# Patient Record
Sex: Male | Born: 1965 | Race: White | Hispanic: No | Marital: Single | Smoking: Former smoker
Health system: Southern US, Community
[De-identification: ages and names within clinical notes are randomized; demographics above are authoritative.]

## PROBLEM LIST (undated history)

## (undated) DIAGNOSIS — F909 Attention-deficit hyperactivity disorder, unspecified type: Secondary | ICD-10-CM

## (undated) DIAGNOSIS — F329 Major depressive disorder, single episode, unspecified: Secondary | ICD-10-CM

## (undated) DIAGNOSIS — G5603 Carpal tunnel syndrome, bilateral upper limbs: Secondary | ICD-10-CM

## (undated) DIAGNOSIS — I872 Venous insufficiency (chronic) (peripheral): Secondary | ICD-10-CM

## (undated) DIAGNOSIS — E785 Hyperlipidemia, unspecified: Secondary | ICD-10-CM

## (undated) DIAGNOSIS — I10 Essential (primary) hypertension: Secondary | ICD-10-CM

## (undated) DIAGNOSIS — E291 Testicular hypofunction: Secondary | ICD-10-CM

## (undated) DIAGNOSIS — F32A Depression, unspecified: Secondary | ICD-10-CM

## (undated) DIAGNOSIS — G4730 Sleep apnea, unspecified: Secondary | ICD-10-CM

## (undated) DIAGNOSIS — D582 Other hemoglobinopathies: Secondary | ICD-10-CM

## (undated) DIAGNOSIS — M67471 Ganglion, right ankle and foot: Secondary | ICD-10-CM

## (undated) HISTORY — DX: Venous insufficiency (chronic) (peripheral): I87.2

## (undated) HISTORY — DX: Ganglion, right ankle and foot: M67.471

## (undated) HISTORY — PX: CARPAL TUNNEL RELEASE: SHX101

## (undated) HISTORY — DX: Attention-deficit hyperactivity disorder, unspecified type: F90.9

## (undated) HISTORY — DX: Hyperlipidemia, unspecified: E78.5

## (undated) HISTORY — DX: Carpal tunnel syndrome, bilateral upper limbs: G56.03

## (undated) HISTORY — PX: VASECTOMY: SHX75

## (undated) HISTORY — DX: Sleep apnea, unspecified: G47.30

## (undated) HISTORY — PX: SHOULDER SURGERY: SHX246

## (undated) HISTORY — DX: Depression, unspecified: F32.A

## (undated) HISTORY — DX: Other hemoglobinopathies: D58.2

## (undated) HISTORY — DX: Testicular hypofunction: E29.1

---
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
## 1999-06-27 ENCOUNTER — Ambulatory Visit (HOSPITAL_COMMUNITY): Admission: RE | Admit: 1999-06-27 | Discharge: 1999-06-27 | Payer: Self-pay | Admitting: Specialist

## 1999-06-27 ENCOUNTER — Encounter: Payer: Self-pay | Admitting: Specialist

## 2003-01-11 ENCOUNTER — Encounter: Payer: Self-pay | Admitting: Emergency Medicine

## 2003-01-11 ENCOUNTER — Emergency Department (HOSPITAL_COMMUNITY): Admission: EM | Admit: 2003-01-11 | Discharge: 2003-01-11 | Payer: Self-pay | Admitting: Emergency Medicine

## 2003-06-27 ENCOUNTER — Emergency Department (HOSPITAL_COMMUNITY): Admission: EM | Admit: 2003-06-27 | Discharge: 2003-06-27 | Payer: Self-pay | Admitting: Emergency Medicine

## 2016-02-17 DIAGNOSIS — F329 Major depressive disorder, single episode, unspecified: Secondary | ICD-10-CM | POA: Diagnosis not present

## 2016-02-17 DIAGNOSIS — Z0000 Encounter for general adult medical examination without abnormal findings: Secondary | ICD-10-CM | POA: Diagnosis not present

## 2016-02-17 DIAGNOSIS — I10 Essential (primary) hypertension: Secondary | ICD-10-CM | POA: Diagnosis not present

## 2016-02-17 DIAGNOSIS — F909 Attention-deficit hyperactivity disorder, unspecified type: Secondary | ICD-10-CM | POA: Diagnosis not present

## 2016-02-17 DIAGNOSIS — E785 Hyperlipidemia, unspecified: Secondary | ICD-10-CM | POA: Diagnosis not present

## 2016-04-16 DIAGNOSIS — M25561 Pain in right knee: Secondary | ICD-10-CM | POA: Diagnosis not present

## 2016-05-07 DIAGNOSIS — Z125 Encounter for screening for malignant neoplasm of prostate: Secondary | ICD-10-CM | POA: Diagnosis not present

## 2016-05-07 DIAGNOSIS — E291 Testicular hypofunction: Secondary | ICD-10-CM | POA: Diagnosis not present

## 2016-05-14 DIAGNOSIS — E291 Testicular hypofunction: Secondary | ICD-10-CM | POA: Diagnosis not present

## 2016-06-17 DIAGNOSIS — S83231A Complex tear of medial meniscus, current injury, right knee, initial encounter: Secondary | ICD-10-CM | POA: Diagnosis not present

## 2016-06-17 DIAGNOSIS — M2341 Loose body in knee, right knee: Secondary | ICD-10-CM | POA: Diagnosis not present

## 2016-06-17 DIAGNOSIS — M25361 Other instability, right knee: Secondary | ICD-10-CM | POA: Diagnosis not present

## 2016-06-17 DIAGNOSIS — S83501A Sprain of unspecified cruciate ligament of right knee, initial encounter: Secondary | ICD-10-CM | POA: Diagnosis not present

## 2016-06-17 DIAGNOSIS — S83241A Other tear of medial meniscus, current injury, right knee, initial encounter: Secondary | ICD-10-CM | POA: Diagnosis not present

## 2016-06-17 DIAGNOSIS — S83281A Other tear of lateral meniscus, current injury, right knee, initial encounter: Secondary | ICD-10-CM | POA: Diagnosis not present

## 2016-06-17 DIAGNOSIS — M94261 Chondromalacia, right knee: Secondary | ICD-10-CM | POA: Diagnosis not present

## 2016-06-17 HISTORY — PX: OTHER SURGICAL HISTORY: SHX169

## 2016-06-20 ENCOUNTER — Encounter (HOSPITAL_BASED_OUTPATIENT_CLINIC_OR_DEPARTMENT_OTHER): Admission: RE | Payer: Self-pay | Source: Ambulatory Visit

## 2016-06-20 ENCOUNTER — Ambulatory Visit (HOSPITAL_BASED_OUTPATIENT_CLINIC_OR_DEPARTMENT_OTHER)
Admission: RE | Admit: 2016-06-20 | Payer: BLUE CROSS/BLUE SHIELD | Source: Ambulatory Visit | Admitting: Orthopedic Surgery

## 2016-06-20 SURGERY — ARTHROSCOPY, KNEE, WITH MEDIAL MENISCECTOMY
Anesthesia: General | Site: Knee | Laterality: Right

## 2016-06-25 DIAGNOSIS — M2341 Loose body in knee, right knee: Secondary | ICD-10-CM | POA: Diagnosis not present

## 2016-07-17 DIAGNOSIS — M2341 Loose body in knee, right knee: Secondary | ICD-10-CM | POA: Diagnosis not present

## 2016-07-17 DIAGNOSIS — M25561 Pain in right knee: Secondary | ICD-10-CM | POA: Diagnosis not present

## 2016-07-19 ENCOUNTER — Encounter (HOSPITAL_BASED_OUTPATIENT_CLINIC_OR_DEPARTMENT_OTHER)
Admission: RE | Disposition: A | Discharge status: Home / Self Care | Payer: Self-pay | Source: Ambulatory Visit | Attending: Orthopedic Surgery

## 2016-07-19 ENCOUNTER — Ambulatory Visit (HOSPITAL_BASED_OUTPATIENT_CLINIC_OR_DEPARTMENT_OTHER)
Admission: RE | Admit: 2016-07-19 | Discharge: 2016-07-19 | Disposition: A | Discharge status: Home / Self Care | Payer: BLUE CROSS/BLUE SHIELD | Source: Ambulatory Visit | Attending: Orthopedic Surgery | Admitting: Orthopedic Surgery

## 2016-07-19 ENCOUNTER — Encounter (HOSPITAL_BASED_OUTPATIENT_CLINIC_OR_DEPARTMENT_OTHER): Payer: Self-pay

## 2016-07-19 ENCOUNTER — Ambulatory Visit (HOSPITAL_BASED_OUTPATIENT_CLINIC_OR_DEPARTMENT_OTHER): Payer: BLUE CROSS/BLUE SHIELD | Admitting: Anesthesiology

## 2016-07-19 ENCOUNTER — Other Ambulatory Visit: Payer: Self-pay | Admitting: Physician Assistant

## 2016-07-19 DIAGNOSIS — F329 Major depressive disorder, single episode, unspecified: Secondary | ICD-10-CM | POA: Insufficient documentation

## 2016-07-19 DIAGNOSIS — I10 Essential (primary) hypertension: Secondary | ICD-10-CM | POA: Diagnosis not present

## 2016-07-19 DIAGNOSIS — M009 Pyogenic arthritis, unspecified: Secondary | ICD-10-CM | POA: Insufficient documentation

## 2016-07-19 DIAGNOSIS — Z888 Allergy status to other drugs, medicaments and biological substances status: Secondary | ICD-10-CM | POA: Diagnosis not present

## 2016-07-19 DIAGNOSIS — F17210 Nicotine dependence, cigarettes, uncomplicated: Secondary | ICD-10-CM | POA: Insufficient documentation

## 2016-07-19 DIAGNOSIS — T814XXA Infection following a procedure, initial encounter: Secondary | ICD-10-CM | POA: Diagnosis not present

## 2016-07-19 DIAGNOSIS — L02415 Cutaneous abscess of right lower limb: Secondary | ICD-10-CM | POA: Diagnosis not present

## 2016-07-19 DIAGNOSIS — G4730 Sleep apnea, unspecified: Secondary | ICD-10-CM | POA: Diagnosis not present

## 2016-07-19 DIAGNOSIS — M2341 Loose body in knee, right knee: Secondary | ICD-10-CM | POA: Diagnosis not present

## 2016-07-19 DIAGNOSIS — B999 Unspecified infectious disease: Secondary | ICD-10-CM

## 2016-07-19 DIAGNOSIS — M869 Osteomyelitis, unspecified: Secondary | ICD-10-CM | POA: Diagnosis not present

## 2016-07-19 HISTORY — DX: Essential (primary) hypertension: I10

## 2016-07-19 HISTORY — DX: Major depressive disorder, single episode, unspecified: F32.9

## 2016-07-19 HISTORY — DX: Depression, unspecified: F32.A

## 2016-07-19 HISTORY — PX: KNEE ARTHROSCOPY: SHX127

## 2016-07-19 HISTORY — PX: INCISION AND DRAINAGE: SHX5863

## 2016-07-19 SURGERY — ARTHROSCOPY, KNEE
Anesthesia: General | Site: Knee | Laterality: Right

## 2016-07-19 MED ORDER — LIDOCAINE 2% (20 MG/ML) 5 ML SYRINGE
INTRAMUSCULAR | Status: DC | PRN
  Administered 2016-07-19: 50 mg via INTRAVENOUS

## 2016-07-19 MED ORDER — LIDOCAINE 2% (20 MG/ML) 5 ML SYRINGE
INTRAMUSCULAR | Status: AC
  Filled 2016-07-19: qty 5

## 2016-07-19 MED ORDER — FENTANYL CITRATE (PF) 100 MCG/2ML IJ SOLN
INTRAMUSCULAR | Status: AC
  Filled 2016-07-19: qty 2

## 2016-07-19 MED ORDER — OXYCODONE-ACETAMINOPHEN 5-325 MG PO TABS: 1.0000 | ORAL_TABLET | ORAL | 0 refills | Status: DC | PRN

## 2016-07-19 MED ORDER — FENTANYL CITRATE (PF) 100 MCG/2ML IJ SOLN
INTRAMUSCULAR | Status: DC | PRN
  Administered 2016-07-19: 50 ug via INTRAVENOUS
  Administered 2016-07-19: 100 ug via INTRAVENOUS

## 2016-07-19 MED ORDER — FENTANYL CITRATE (PF) 100 MCG/2ML IJ SOLN
25.0000 ug | INTRAMUSCULAR | Status: DC | PRN
  Administered 2016-07-19 (×2): 25 ug via INTRAVENOUS
  Administered 2016-07-19: 50 ug via INTRAVENOUS

## 2016-07-19 MED ORDER — EPHEDRINE SULFATE 50 MG/ML IJ SOLN
INTRAMUSCULAR | Status: DC | PRN
  Administered 2016-07-19: 10 mg via INTRAVENOUS
  Administered 2016-07-19: 15 mg via INTRAVENOUS

## 2016-07-19 MED ORDER — MIDAZOLAM HCL 2 MG/2ML IJ SOLN
INTRAMUSCULAR | Status: AC
  Filled 2016-07-19: qty 2

## 2016-07-19 MED ORDER — DEXAMETHASONE SODIUM PHOSPHATE 10 MG/ML IJ SOLN
INTRAMUSCULAR | Status: AC
  Filled 2016-07-19: qty 1

## 2016-07-19 MED ORDER — PHENYLEPHRINE HCL 10 MG/ML IJ SOLN
INTRAVENOUS | Status: DC | PRN
  Administered 2016-07-19: 50 ug/min via INTRAVENOUS

## 2016-07-19 MED ORDER — MIDAZOLAM HCL 5 MG/5ML IJ SOLN
INTRAMUSCULAR | Status: DC | PRN
  Administered 2016-07-19: 2 mg via INTRAVENOUS

## 2016-07-19 MED ORDER — POVIDONE-IODINE 7.5 % EX SOLN: Freq: Once | CUTANEOUS | Status: DC

## 2016-07-19 MED ORDER — LACTATED RINGERS IV SOLN: INTRAVENOUS | Status: DC

## 2016-07-19 MED ORDER — OXYCODONE HCL 5 MG PO TABS
5.0000 mg | ORAL_TABLET | Freq: Once | ORAL | Status: AC | PRN
  Administered 2016-07-19: 5 mg via ORAL

## 2016-07-19 MED ORDER — ONDANSETRON HCL 4 MG PO TABS: 4.0000 mg | ORAL_TABLET | Freq: Three times a day (TID) | ORAL | 0 refills | Status: DC | PRN

## 2016-07-19 MED ORDER — POLYMYXIN B-TRIMETHOPRIM 10000-0.1 UNIT/ML-% OP SOLN
1.0000 [drp] | Freq: Three times a day (TID) | OPHTHALMIC | Status: DC
  Administered 2016-07-19: 1 [drp] via OPHTHALMIC

## 2016-07-19 MED ORDER — BSS IO SOLN
INTRAOCULAR | Status: AC
  Filled 2016-07-19: qty 15

## 2016-07-19 MED ORDER — KETOROLAC TROMETHAMINE 0.5 % OP SOLN
OPHTHALMIC | Status: AC
  Filled 2016-07-19: qty 5

## 2016-07-19 MED ORDER — EPHEDRINE 5 MG/ML INJ
INTRAVENOUS | Status: AC
  Filled 2016-07-19: qty 10

## 2016-07-19 MED ORDER — PROPOFOL 10 MG/ML IV BOLUS
INTRAVENOUS | Status: DC | PRN
  Administered 2016-07-19: 200 mg via INTRAVENOUS

## 2016-07-19 MED ORDER — DEXAMETHASONE SODIUM PHOSPHATE 4 MG/ML IJ SOLN
INTRAMUSCULAR | Status: DC | PRN
  Administered 2016-07-19: 10 mg via INTRAVENOUS

## 2016-07-19 MED ORDER — BSS IO SOLN
15.0000 mL | Freq: Once | INTRAOCULAR | Status: AC
  Administered 2016-07-19: 15 mL

## 2016-07-19 MED ORDER — ACETAMINOPHEN 500 MG PO TABS
ORAL_TABLET | ORAL | Status: AC
  Filled 2016-07-19: qty 2

## 2016-07-19 MED ORDER — ONDANSETRON HCL 4 MG/2ML IJ SOLN
INTRAMUSCULAR | Status: AC
  Filled 2016-07-19: qty 2

## 2016-07-19 MED ORDER — LACTATED RINGERS IV SOLN
INTRAVENOUS | Status: DC
  Administered 2016-07-19: 12:00:00 via INTRAVENOUS

## 2016-07-19 MED ORDER — PROMETHAZINE HCL 25 MG/ML IJ SOLN: 6.2500 mg | INTRAMUSCULAR | Status: DC | PRN

## 2016-07-19 MED ORDER — KETOROLAC TROMETHAMINE 30 MG/ML IJ SOLN
INTRAMUSCULAR | Status: AC
  Filled 2016-07-19: qty 1

## 2016-07-19 MED ORDER — CEFAZOLIN SODIUM-DEXTROSE 2-4 GM/100ML-% IV SOLN
2.0000 g | INTRAVENOUS | Status: AC
  Administered 2016-07-19: 2 g via INTRAVENOUS

## 2016-07-19 MED ORDER — BUPIVACAINE HCL (PF) 0.5 % IJ SOLN
INTRAMUSCULAR | Status: AC
  Filled 2016-07-19: qty 30

## 2016-07-19 MED ORDER — BUPIVACAINE HCL 0.25 % IJ SOLN
INTRAMUSCULAR | Status: DC | PRN
  Administered 2016-07-19: 5 mL

## 2016-07-19 MED ORDER — KETOROLAC TROMETHAMINE 0.5 % OP SOLN
1.0000 [drp] | Freq: Three times a day (TID) | OPHTHALMIC | Status: DC | PRN
  Administered 2016-07-19: 1 [drp] via OPHTHALMIC

## 2016-07-19 MED ORDER — OXYCODONE HCL 5 MG PO TABS
ORAL_TABLET | ORAL | Status: AC
  Filled 2016-07-19: qty 1

## 2016-07-19 MED ORDER — CEFAZOLIN SODIUM-DEXTROSE 2-4 GM/100ML-% IV SOLN
INTRAVENOUS | Status: AC
  Filled 2016-07-19: qty 100

## 2016-07-19 MED ORDER — ACETAMINOPHEN 500 MG PO TABS
1000.0000 mg | ORAL_TABLET | Freq: Once | ORAL | Status: AC
  Administered 2016-07-19: 1000 mg via ORAL

## 2016-07-19 MED ORDER — POLYMYXIN B-TRIMETHOPRIM 10000-0.1 UNIT/ML-% OP SOLN
OPHTHALMIC | Status: AC
  Filled 2016-07-19: qty 10

## 2016-07-19 MED ORDER — KETOROLAC TROMETHAMINE 30 MG/ML IJ SOLN
INTRAMUSCULAR | Status: DC | PRN
  Administered 2016-07-19: 30 mg via INTRAVENOUS

## 2016-07-19 MED ORDER — ONDANSETRON HCL 4 MG/2ML IJ SOLN
INTRAMUSCULAR | Status: DC | PRN
  Administered 2016-07-19: 4 mg via INTRAVENOUS

## 2016-07-19 SURGICAL SUPPLY — 42 items
BANDAGE ACE 6X5 VEL STRL LF (GAUZE/BANDAGES/DRESSINGS) ×3 IMPLANT
BANDAGE ESMARK 6X9 LF (GAUZE/BANDAGES/DRESSINGS) IMPLANT
BLADE CUDA 5.5 (BLADE) IMPLANT
BLADE CUDA GRT WHITE 3.5 (BLADE) IMPLANT
BLADE CUTTER GATOR 3.5 (BLADE) ×3 IMPLANT
BLADE CUTTER MENIS 5.5 (BLADE) IMPLANT
BNDG CMPR 9X6 STRL LF SNTH (GAUZE/BANDAGES/DRESSINGS) ×1
BNDG COHESIVE 4X5 TAN STRL (GAUZE/BANDAGES/DRESSINGS) ×2 IMPLANT
BNDG ESMARK 6X9 LF (GAUZE/BANDAGES/DRESSINGS) ×3
BNDG GAUZE ELAST 4 BULKY (GAUZE/BANDAGES/DRESSINGS) ×2 IMPLANT
CHLORAPREP W/TINT 26ML (MISCELLANEOUS) ×5 IMPLANT
CUTTER MENISCUS  4.2MM (BLADE)
CUTTER MENISCUS 4.2MM (BLADE) IMPLANT
DRAPE ARTHROSCOPY W/POUCH 90 (DRAPES) ×3 IMPLANT
DRAPE IMP U-DRAPE 54X76 (DRAPES) ×3 IMPLANT
DRAPE U-SHAPE 47X51 STRL (DRAPES) ×3 IMPLANT
DRSG EMULSION OIL 3X3 NADH (GAUZE/BANDAGES/DRESSINGS) ×3 IMPLANT
DRSG TEGADERM 2-3/8X2-3/4 SM (GAUZE/BANDAGES/DRESSINGS) ×2 IMPLANT
GAUZE SPONGE 2X2 12PLY NS (GAUZE/BANDAGES/DRESSINGS) ×2 IMPLANT
GAUZE SPONGE 4X4 12PLY STRL (GAUZE/BANDAGES/DRESSINGS) ×6 IMPLANT
GLOVE BIO SURGEON STRL SZ7.5 (GLOVE) ×6 IMPLANT
GLOVE BIOGEL PI IND STRL 8 (GLOVE) ×2 IMPLANT
GLOVE BIOGEL PI INDICATOR 8 (GLOVE) ×4
GOWN STRL REUS W/ TWL LRG LVL3 (GOWN DISPOSABLE) ×3 IMPLANT
GOWN STRL REUS W/ TWL XL LVL3 (GOWN DISPOSABLE) ×1 IMPLANT
GOWN STRL REUS W/TWL LRG LVL3 (GOWN DISPOSABLE) ×9
GOWN STRL REUS W/TWL XL LVL3 (GOWN DISPOSABLE) ×3
KNEE WRAP E Z 3 GEL PACK (MISCELLANEOUS) IMPLANT
MANIFOLD NEPTUNE II (INSTRUMENTS) ×3 IMPLANT
PACK ARTHROSCOPY DSU (CUSTOM PROCEDURE TRAY) ×3 IMPLANT
PACK BASIN DAY SURGERY FS (CUSTOM PROCEDURE TRAY) ×3 IMPLANT
PADDING CAST ABS 6INX4YD NS (CAST SUPPLIES) ×2
PADDING CAST ABS COTTON 6X4 NS (CAST SUPPLIES) IMPLANT
PROBE BIPOLAR ATHRO 135MM 90D (MISCELLANEOUS) IMPLANT
SET ARTHROSCOPY TUBING (MISCELLANEOUS) ×3
SET ARTHROSCOPY TUBING LN (MISCELLANEOUS) ×1 IMPLANT
SPONGE GAUZE 4X4 12PLY STER LF (GAUZE/BANDAGES/DRESSINGS) ×2 IMPLANT
SUT ETHILON 3 0 PS 1 (SUTURE) ×5 IMPLANT
TAPE CLOTH 3X10 TAN LF (GAUZE/BANDAGES/DRESSINGS) IMPLANT
TOWEL OR 17X24 6PK STRL BLUE (TOWEL DISPOSABLE) ×3 IMPLANT
TOWEL OR NON WOVEN STRL DISP B (DISPOSABLE) ×3 IMPLANT
WATER STERILE IRR 1000ML POUR (IV SOLUTION) ×3 IMPLANT

## 2016-07-19 NOTE — Anesthesia Preprocedure Evaluation (Addendum)
Anesthesia Evaluation  Patient identified by MRN, date of birth, ID band Patient awake    Reviewed: Allergy & Precautions, NPO status , Patient's Chart, lab work & pertinent test results  Airway Mallampati: II  TM Distance: >3 FB Neck ROM: Full    Dental  (+) Teeth Intact, Dental Advisory Given   Pulmonary sleep apnea and Continuous Positive Airway Pressure Ventilation , Current Smoker,    Pulmonary exam normal breath sounds clear to auscultation       Cardiovascular Exercise Tolerance: Good hypertension, Pt. on medications Normal cardiovascular exam Rhythm:Regular Rate:Normal     Neuro/Psych PSYCHIATRIC DISORDERS Depression negative neurological ROS     GI/Hepatic negative GI ROS, Neg liver ROS,   Endo/Other  negative endocrine ROS  Renal/GU negative Renal ROS     Musculoskeletal negative musculoskeletal ROS (+)   Abdominal   Peds  Hematology negative hematology ROS (+)   Anesthesia Other Findings Day of surgery medications reviewed with the patient.  Reproductive/Obstetrics                           Anesthesia Physical Anesthesia Plan  ASA: II  Anesthesia Plan: General   Post-op Pain Management:    Induction: Intravenous  Airway Management Planned: LMA  Additional Equipment:   Intra-op Plan:   Post-operative Plan: Extubation in OR  Informed Consent: I have reviewed the patients History and Physical, chart, labs and discussed the procedure including the risks, benefits and alternatives for the proposed anesthesia with the patient or authorized representative who has indicated his/her understanding and acceptance.   Dental advisory given  Plan Discussed with: CRNA  Anesthesia Plan Comments: (Risks/benefits of general anesthesia discussed with patient including risk of damage to teeth, lips, gum, and tongue, nausea/vomiting, allergic reactions to medications, and the  possibility of heart attack, stroke and death.  All patient questions answered.  Patient wishes to proceed.)        Anesthesia Quick Evaluation

## 2016-07-19 NOTE — Anesthesia Procedure Notes (Signed)
Procedure Name: LMA Insertion Performed by: Mahad Newstrom W Pre-anesthesia Checklist: Patient identified, Emergency Drugs available, Suction available and Patient being monitored Patient Re-evaluated:Patient Re-evaluated prior to inductionOxygen Delivery Method: Circle system utilized Preoxygenation: Pre-oxygenation with 100% oxygen Intubation Type: IV induction Ventilation: Mask ventilation without difficulty LMA: LMA inserted LMA Size: 5.0 Number of attempts: 1 Placement Confirmation: positive ETCO2 Tube secured with: Tape Dental Injury: Teeth and Oropharynx as per pre-operative assessment        

## 2016-07-19 NOTE — H&P (Signed)
     ORTHOPAEDIC CONSULTATION  REQUESTING PHYSICIAN: Renette Butters, MD  Chief Complaint: infection Right leg  HPI: Terry Harper is a 50 y.o. male who complains of pain at swelling at proximal tibia. S/p ACL recon 2-3weeks ago.   Past Medical History:  Diagnosis Date  . Depression   . Hypertension    Past Surgical History:  Procedure Laterality Date  . R Knee scope Right 06/17/2016   Social History   Social History  . Marital status: Single    Spouse name: N/A  . Number of children: N/A  . Years of education: N/A   Social History Main Topics  . Smoking status: Light Tobacco Smoker    Packs/day: 0.25    Years: 15.00    Types: Cigars  . Smokeless tobacco: Never Used  . Alcohol use 1.2 oz/week    1 Cans of beer, 1 Shots of liquor per week  . Drug use:   . Sexual activity: Not Asked   Other Topics Concern  . None   Social History Narrative  . None   History reviewed. No pertinent family history. Allergies  Allergen Reactions  . Prednisone Palpitations   Prior to Admission medications   Medication Sig Start Date End Date Taking? Authorizing Provider  lisdexamfetamine (VYVANSE) 70 MG capsule Take 70 mg by mouth daily.   Yes Historical Provider, MD  lisinopril (PRINIVIL,ZESTRIL) 20 MG tablet Take 20 mg by mouth daily.   Yes Historical Provider, MD  venlafaxine (EFFEXOR) 75 MG tablet Take 75 mg by mouth every morning.   Yes Historical Provider, MD   No results found.  Positive ROS: All other systems have been reviewed and were otherwise negative with the exception of those mentioned in the HPI and as above.  Labs cbc No results for input(s): WBC, HGB, HCT, PLT in the last 72 hours.  Labs inflam No results for input(s): CRP in the last 72 hours.  Invalid input(s): ESR  Labs coag No results for input(s): INR, PTT in the last 72 hours.  Invalid input(s): PT  No results for input(s): NA, K, CL, CO2, GLUCOSE, BUN, CREATININE, CALCIUM in the last 72  hours.  Physical Exam: There were no vitals filed for this visit. General: Alert, no acute distress Cardiovascular: No pedal edema Respiratory: No cyanosis, no use of accessory musculature GI: No organomegaly, abdomen is soft and non-tender Skin: No lesions in the area of chief complaint other than those listed below in MSK exam.  Neurologic: Sensation intact distally save for the below mentioned MSK exam Psychiatric: Patient is competent for consent with normal mood and affect Lymphatic: No axillary or cervical lymphadenopathy  MUSCULOSKELETAL:  RLE: purulent drainaige from bedside I&D site.  Other extremities are atraumatic with painless ROM and NVI.  Assessment: Infection R leg  Plan: Local I&D Followed by arthroscopic knee wash out   Renette Butters, MD Cell [phone]   07/19/2016 12:39 PM

## 2016-07-19 NOTE — Transfer of Care (Signed)
Immediate Anesthesia Transfer of Care Note  Patient: Terry Harper  Procedure(s) Performed: Procedure(s): ARTHROSCOPY RIGHT KNEE WITH CONDROPLASTY (Right) INCISION AND DRAINAGE (Right)  Patient Location: PACU  Anesthesia Type:General  Level of Consciousness: awake and sedated  Airway & Oxygen Therapy: Patient Spontanous Breathing and Patient connected to face mask oxygen  Post-op Assessment: Report given to RN and Post -op Vital signs reviewed and stable  Post vital signs: Reviewed and stable  Last Vitals: There were no vitals filed for this visit.  Last Pain:  Vitals:   07/19/16 1234  PainSc: 5       Patients Stated Pain Goal: 2 (07/19/16 1234)  Complications: No apparent anesthesia complications

## 2016-07-19 NOTE — Op Note (Signed)
07/19/2016  1:57 PM  PATIENT:  Terry Harper    PRE-OPERATIVE DIAGNOSIS:  INFECTION RIGHT KNEE  POST-OPERATIVE DIAGNOSIS:  Same  PROCEDURE:  ARTHROSCOPY RIGHT KNEE WITH CONDROPLASTY, INCISION AND DRAINAGE  SURGEON:  MURPHY, TIMOTHY D, MD  ASSISTANT: Terry HackerHenry Martensen, PA-C, he was present and scrubbed throughout the case, critical for completion in a timely fashion, and for retraction, instrumentation, and closure.   ANESTHESIA:   gen  PREOPERATIVE INDICATIONS:  Terry Harper is a  50 y.o. male with a diagnosis of INFECTION RIGHT KNEE who failed conservative measures and elected for surgical management.    The risks benefits and alternatives were discussed with the patient preoperatively including but not limited to the risks of infection, bleeding, nerve injury, cardiopulmonary complications, the need for revision surgery, among others, and the patient was willing to proceed.  OPERATIVE IMPLANTS: none  OPERATIVE FINDINGS: purulent leg abscess  BLOOD LOSS: min  COMPLICATIONS: none  TOURNIQUET TIME: none  OPERATIVE PROCEDURE:  Patient was identified in the preoperative holding area and site was marked by me He was transported to the operating theater and placed on the table in supine position taking care to pad all bony prominences. After a preincinduction time out anesthesia was induced. The right lower extremity was prepped and draped in normal sterile fashion and a pre-incision timeout was performed. He received ancef for preoperative antibiotics after cultures were sent.   I prepped and draped his right lower extremity I kept his scope equipment separate.  I extended his pretibial incision to a roughly 3-1/2 cm incision. Dissected down into his abscess space. As able to fully expose his space to confirm no loculated collections of purulent fluid. I did express a fair amount initially and I sent this for culture.  I then thoroughly irrigated this space with a 3 L of saline.  Next I  debrided devitalized tissue using a Ronjair and scissors this was an excisional debridement. I then copiously irrigated again. I placed a Penrose drain and loosely close this incision. I then covered it with a sterile dressing.   I then reprepped the knee changed gloves changed all equipment placed the scope equipment on the field I made a inferior medial and lateral patellar portals and a superior lateral portal for outflow. I sent cultures of the initial joint fluid. I then gave his preoperative antibiotics.  I inserted the arthroscope there is no obvious purulent fluid within the knee. His graft was intact. I used the shaver to debride some early scar tissue and synovium. I ran another copious amount of irrigation through his knee roughly 6 L. After this I removed the arthroscope I closed these portals and placed a sterile occlusive dressing. I then placed a large dressing to allow for drainage over the pretibial incision to keep the separate. He was then awoken and taken the PACU in stable condition  POST OPERATIVE PLAN: Continue by mouth antibiotics we'll discuss with primary team about possible PICC line placement and IV antibiotics we'll follow cultures weightbearing as tolerated

## 2016-07-19 NOTE — Discharge Instructions (Signed)
Keep leg elevated with ice as much as possible to reduce pain / swelling.  Continue antibiotics as prescribed.  Weight Bearing:   Weight bearing as tolerated.    Diet: As you were doing prior to hospitalization   Shower:  Keep the bandages dry, use an occlusive plastic wrap, NO SOAKING IN TUB.  If the bandage gets wet, change with a clean dry gauze.   Dressing:  Leave dressings in place and we will change your bandages during your next follow-up appointment.    Activity:  Increase activity slowly as tolerated, but follow the weight bearing instructions below.  The rules on driving is that you can not be taking narcotics while you drive, and you must feel in control of the vehicle.    To prevent constipation: you may use a stool softener such as -  Colace (over the counter) 100 mg by mouth twice a day  Drink plenty of fluids (prune juice may be helpful) and high fiber foods Miralax (over the counter) for constipation as needed.    Itching:  If you experience itching with your medications, try taking only a single pain pill, or even half a pain pill at a time.  You may take up to 10 pain pills per day, and you can also use benadryl over the counter for itching or also to help with sleep.   Precautions:  If you experience chest pain or shortness of breath - call 911 immediately for transfer to the hospital emergency department!!  If you develop a fever greater that 101 F, purulent drainage from wound, increased redness or drainage from wound, or calf pain -- Call the office at [phone]                                                Follow- Up Appointment:  Please call for an appointment unless already scheduled - [phone]    Post Anesthesia Home Care Instructions  Activity: Get plenty of rest for the remainder of the day. A responsible adult should stay with you for 24 hours following the procedure.  For the next 24 hours, DO NOT: -Drive a car -Advertising copywriterperate machinery -Drink  alcoholic beverages -Take any medication unless instructed by your physician -Make any legal decisions or sign important papers.  Meals: Start with liquid foods such as gelatin or soup. Progress to regular foods as tolerated. Avoid greasy, spicy, heavy foods. If nausea and/or vomiting occur, drink only clear liquids until the nausea and/or vomiting subsides. Call your physician if vomiting continues.  Special Instructions/Symptoms: Your throat may feel dry or sore from the anesthesia or the breathing tube placed in your throat during surgery. If this causes discomfort, gargle with warm salt water. The discomfort should disappear within 24 hours.  If you had a scopolamine patch placed behind your ear for the management of post- operative nausea and/or vomiting:  1. The medication in the patch is effective for 72 hours, after which it should be removed.  Wrap patch in a tissue and discard in the trash. Wash hands thoroughly with soap and water. 2. You may remove the patch earlier than 72 hours if you experience unpleasant side effects which may include dry mouth, dizziness or visual disturbances. 3. Avoid touching the patch. Wash your hands with soap and water after contact with the patch.

## 2016-07-19 NOTE — Anesthesia Postprocedure Evaluation (Signed)
Anesthesia Post Note  Patient: Terry Harper  Procedure(s) Performed: Procedure(s) (LRB): ARTHROSCOPY RIGHT KNEE WITH CONDROPLASTY (Right) INCISION AND DRAINAGE (Right)  Patient location during evaluation: PACU Anesthesia Type: General Level of consciousness: awake and alert Pain management: pain level controlled Vital Signs Assessment: post-procedure vital signs reviewed and stable Respiratory status: spontaneous breathing, nonlabored ventilation, respiratory function stable and patient connected to nasal cannula oxygen Cardiovascular status: blood pressure returned to baseline and stable Postop Assessment: no signs of nausea or vomiting Anesthetic complications: no    Last Vitals:  Vitals:   07/19/16 1545 07/19/16 1600  BP: 126/80 127/77  Pulse: 78 80  Resp: 18 16  Temp:  36.8 C    Last Pain:  Vitals:   07/19/16 1600  PainSc: 3                  Cecile HearingStephen Edward Turk

## 2016-07-22 ENCOUNTER — Ambulatory Visit (HOSPITAL_COMMUNITY)
Admission: RE | Admit: 2016-07-22 | Discharge: 2016-07-22 | Disposition: A | Discharge status: Home / Self Care | Payer: BLUE CROSS/BLUE SHIELD | Source: Ambulatory Visit | Attending: Interventional Radiology | Admitting: Interventional Radiology

## 2016-07-22 ENCOUNTER — Other Ambulatory Visit (HOSPITAL_COMMUNITY): Payer: Self-pay | Admitting: Interventional Radiology

## 2016-07-22 ENCOUNTER — Encounter: Payer: Self-pay | Admitting: Internal Medicine

## 2016-07-22 ENCOUNTER — Ambulatory Visit (HOSPITAL_COMMUNITY)
Admission: RE | Admit: 2016-07-22 | Discharge: 2016-07-22 | Disposition: A | Discharge status: Home / Self Care | Payer: BLUE CROSS/BLUE SHIELD | Source: Ambulatory Visit | Attending: Physician Assistant | Admitting: Physician Assistant

## 2016-07-22 ENCOUNTER — Encounter (HOSPITAL_COMMUNITY): Payer: Self-pay

## 2016-07-22 ENCOUNTER — Ambulatory Visit (INDEPENDENT_AMBULATORY_CARE_PROVIDER_SITE_OTHER): Payer: BLUE CROSS/BLUE SHIELD | Admitting: Internal Medicine

## 2016-07-22 DIAGNOSIS — Z9889 Other specified postprocedural states: Secondary | ICD-10-CM | POA: Insufficient documentation

## 2016-07-22 DIAGNOSIS — T814XXA Infection following a procedure, initial encounter: Secondary | ICD-10-CM | POA: Diagnosis not present

## 2016-07-22 DIAGNOSIS — B999 Unspecified infectious disease: Secondary | ICD-10-CM

## 2016-07-22 DIAGNOSIS — T8149XA Infection following a procedure, other surgical site, initial encounter: Secondary | ICD-10-CM | POA: Insufficient documentation

## 2016-07-22 DIAGNOSIS — M1990 Unspecified osteoarthritis, unspecified site: Secondary | ICD-10-CM | POA: Insufficient documentation

## 2016-07-22 DIAGNOSIS — M150 Primary generalized (osteo)arthritis: Secondary | ICD-10-CM | POA: Diagnosis not present

## 2016-07-22 DIAGNOSIS — F17290 Nicotine dependence, other tobacco product, uncomplicated: Secondary | ICD-10-CM | POA: Insufficient documentation

## 2016-07-22 DIAGNOSIS — M2341 Loose body in knee, right knee: Secondary | ICD-10-CM | POA: Diagnosis not present

## 2016-07-22 DIAGNOSIS — Z452 Encounter for adjustment and management of vascular access device: Secondary | ICD-10-CM | POA: Diagnosis not present

## 2016-07-22 DIAGNOSIS — L03115 Cellulitis of right lower limb: Secondary | ICD-10-CM | POA: Insufficient documentation

## 2016-07-22 DIAGNOSIS — M159 Polyosteoarthritis, unspecified: Secondary | ICD-10-CM

## 2016-07-22 DIAGNOSIS — IMO0001 Reserved for inherently not codable concepts without codable children: Secondary | ICD-10-CM

## 2016-07-22 HISTORY — DX: Unspecified osteoarthritis, unspecified site: M19.90

## 2016-07-22 HISTORY — DX: Infection following a procedure, other surgical site, initial encounter: T81.49XA

## 2016-07-22 HISTORY — PX: IR GENERIC HISTORICAL: IMG1180011

## 2016-07-22 HISTORY — DX: Nicotine dependence, other tobacco product, uncomplicated: F17.290

## 2016-07-22 HISTORY — DX: Other specified postprocedural states: Z98.890

## 2016-07-22 LAB — CBC
HCT: 45.4 % (ref 38.5–50.0)
HEMOGLOBIN: 15 g/dL (ref 13.2–17.1)
MCH: 31.5 pg (ref 27.0–33.0)
MCHC: 33 g/dL (ref 32.0–36.0)
MCV: 95.4 fL (ref 80.0–100.0)
MPV: 8.8 fL (ref 7.5–12.5)
Platelets: 532 10*3/uL — ABNORMAL HIGH (ref 140–400)
RBC: 4.76 MIL/uL (ref 4.20–5.80)
RDW: 13.6 % (ref 11.0–15.0)
WBC: 9.3 10*3/uL (ref 3.8–10.8)

## 2016-07-22 LAB — BASIC METABOLIC PANEL
BUN: 24 mg/dL (ref 7–25)
CALCIUM: 9.5 mg/dL (ref 8.6–10.3)
CHLORIDE: 99 mmol/L (ref 98–110)
CO2: 29 mmol/L (ref 20–31)
CREATININE: 0.94 mg/dL (ref 0.60–1.35)
GLUCOSE: 71 mg/dL (ref 65–99)
Potassium: 4.7 mmol/L (ref 3.5–5.3)
SODIUM: 137 mmol/L (ref 135–146)

## 2016-07-22 MED ORDER — CIPROFLOXACIN HCL 500 MG PO TABS: 500.0000 mg | ORAL_TABLET | Freq: Two times a day (BID) | ORAL | 0 refills | Status: DC

## 2016-07-22 MED ORDER — LIDOCAINE HCL 1 % IJ SOLN
INTRAMUSCULAR | Status: AC
  Filled 2016-07-22: qty 20

## 2016-07-22 MED ORDER — HEPARIN SOD (PORK) LOCK FLUSH 100 UNIT/ML IV SOLN
500.0000 [IU] | Freq: Once | INTRAVENOUS | Status: AC
  Administered 2016-07-22: 500 [IU] via INTRAVENOUS

## 2016-07-22 MED ORDER — VANCOMYCIN HCL IN DEXTROSE 1-5 GM/200ML-% IV SOLN: 1000.0000 mg | INTRAVENOUS | Status: DC

## 2016-07-22 MED ORDER — LIDOCAINE HCL 1 % IJ SOLN
INTRAMUSCULAR | Status: DC | PRN
  Administered 2016-07-22: 5 mL

## 2016-07-22 MED ORDER — HEPARIN SOD (PORK) LOCK FLUSH 100 UNIT/ML IV SOLN
INTRAVENOUS | Status: AC
  Filled 2016-07-22: qty 5

## 2016-07-22 MED ORDER — VANCOMYCIN HCL 1000 MG IV SOLR: 1000.0000 mg | Freq: Once | INTRAVENOUS | Status: DC

## 2016-07-22 MED ORDER — VANCOMYCIN HCL IN DEXTROSE 1-5 GM/200ML-% IV SOLN
1000.0000 mg | Freq: Once | INTRAVENOUS | Status: AC
  Administered 2016-07-22: 1000 mg via INTRAVENOUS
  Filled 2016-07-22: qty 200

## 2016-07-22 NOTE — Procedures (Signed)
Successful placement of single lumen PICC line to right basilic vein. Length 42 cm Tip at lower SVC/RA No complications Ready for use.  Brayton ElBRUNING, Kymir Coles PA-C 1:38 PM

## 2016-07-22 NOTE — Progress Notes (Signed)
[facility]  Reason for Consult: Postoperative right knee wound infection Referring Physician: Dr. Fredonia Highland  Patient Active Problem List   Diagnosis Date Noted  . Postoperative wound infection 07/22/2016    Priority: High  . DJD (degenerative joint disease) 07/22/2016  . S/P ACL reconstruction 07/22/2016  . Cigar smoker 07/22/2016    Patient's Medications  New Prescriptions   No medications on file  Previous Medications   CEPHALEXIN (KEFLEX) 500 MG CAPSULE    Take 1 capsule by mouth 2 (two) times daily.   DOXYCYCLINE (VIBRA-TABS) 100 MG TABLET    Take 1 tablet by mouth 2 (two) times daily.   LISDEXAMFETAMINE (VYVANSE) 70 MG CAPSULE    Take 70 mg by mouth daily.   LISINOPRIL (PRINIVIL,ZESTRIL) 20 MG TABLET    Take 20 mg by mouth daily.   LOVASTATIN (MEVACOR) 40 MG TABLET    Take 40 mg by mouth at bedtime.   MELOXICAM (MOBIC) 15 MG TABLET    Take 1 tablet by mouth daily as needed.   ONDANSETRON (ZOFRAN) 4 MG TABLET    Take 1 tablet (4 mg total) by mouth every 8 (eight) hours as needed for nausea or vomiting.   OXYCODONE-ACETAMINOPHEN (ROXICET) 5-325 MG TABLET    Take 1-2 tablets by mouth every 4 (four) hours as needed for severe pain.   TESTOSTERONE CYPIONATE (DEPOTESTOSTERONE CYPIONATE) 200 MG/ML INJECTION    Inject 0.75 mLs into the muscle once a week.   VENLAFAXINE (EFFEXOR) 75 MG TABLET    Take 75 mg by mouth every morning.  Modified Medications   No medications on file  Discontinued Medications   No medications on file    Recommendations: 1. Check CBC, BMP, ESR and CRP 2. Start IV vancomycin and oral ciprofloxacin 3. Discontinue doxycycline and cephalexin 4. Follow-up in 2 weeks   Assessment: [name] has a culture-negative postoperative wound infection. The operative report did not suggest extension into the joint but that is always a concern. I reviewed management options with him and we elected to start broad empiric therapy  with IV vancomycin and oral ciprofloxacin. He will follow-up in 2 weeks.  HPI: Terry Harper is a 50 y.o. male who underwent right knee anterior cruciate ligament graft reconstruction on 06/17/2016. He did well initially but about 2 weeks postop he began to develop swelling and pain around his pretibial incision. The wound was opened and cultured on 07/17/2016. The Gram stain showed no organisms and cultures were negative. He started on oral doxycycline and cephalexin. He underwent incision and drainage of a wound abscess on 07/19/2016. He also underwent a separate arthroscopic evaluation that did not suggest infection of the joint. Operative Gram stain of both sites shows no organisms. Cultures are negative at 48 hours.  Review of Systems: Review of Systems  Constitutional: Positive for chills and fever. Negative for diaphoresis.  Musculoskeletal: Positive for joint pain.      Past Medical History:  Diagnosis Date  . Depression   . Hypertension     Social History  Substance Use Topics  . Smoking status: Light Tobacco Smoker    Packs/day: 0.25    Years: 15.00    Types: Cigars  . Smokeless tobacco: Never Used  . Alcohol use 1.2 oz/week    1 Cans of beer, 1 Shots of liquor per week    No family history on file. Allergies  Allergen Reactions  . Prednisone Palpitations  OBJECTIVE: Vitals:   07/22/16 0849  BP: (!) 148/88  Pulse: (!) 102  Temp: 98 F (36.7 C)  TempSrc: Oral  Weight: 236 lb (107 kg)  Height: '6\' 2"'  (1.88 m)   Body mass index is 30.3 kg/m.   Physical Exam  Constitutional: He is oriented to person, place, and time. No distress.  Musculoskeletal:  He has clean dry gauze dressings on his arthroscopic portals. He has sutures in his pretibial incision with a large Penrose drain. There is copious bloody drainage on his gauze dressing. There is mild diffuse swelling of the knee.  Neurological: He is alert and oriented to person, place, and time.  Skin: No rash  noted.  Psychiatric: Mood and affect normal.    Microbiology: Recent Results (from the past 240 hour(s))  Aerobic/Anaerobic Culture (surgical/deep wound)     Status: None (Preliminary result)   Collection Time: 07/19/16  1:15 PM  Result Value Ref Range Status   Specimen Description ABSCESS RIGHT LEG  Final   Special Requests NONE  Final   Gram Stain   Final    ABUNDANT WBC PRESENT, PREDOMINANTLY PMN NO ORGANISMS SEEN    Culture   Final    NO GROWTH 2 DAYS NO ANAEROBES ISOLATED; CULTURE IN PROGRESS FOR 5 DAYS   Report Status PENDING  Incomplete  Aerobic/Anaerobic Culture (surgical/deep wound)     Status: None (Preliminary result)   Collection Time: 07/19/16  1:27 PM  Result Value Ref Range Status   Specimen Description ABSCESS RIGHT KNEE  Final   Special Requests NONE  Final   Gram Stain   Final    MODERATE WBC PRESENT, PREDOMINANTLY PMN NO ORGANISMS SEEN    Culture   Final    NO GROWTH 2 DAYS NO ANAEROBES ISOLATED; CULTURE IN PROGRESS FOR 5 DAYS   Report Status PENDING  Incomplete    Michel Bickers, MD [facility] [phone] pager   [phone] cell 07/22/2016, 9:17 AM

## 2016-07-22 NOTE — Progress Notes (Addendum)
1)  [facility] to arrange first dose of IV vancomycin following PICC placement in [facility].  [facility]st dose [facility].  Fax confirmation received.  [facility].  2)  [facility] let [facility] know that patient is having PICC placed and 1st dose of IV vancomycin today, 07/22/16.  IV vancomycin orders, lab orders, demographics sheet, and insurance information [facility].  [facility]-of-care visit scheduled for tomorrow, 07/23/16, [facility] per [place], [facility].  Confirmation received.

## 2016-07-23 DIAGNOSIS — T8189XA Other complications of procedures, not elsewhere classified, initial encounter: Secondary | ICD-10-CM | POA: Diagnosis not present

## 2016-07-23 DIAGNOSIS — T814XXA Infection following a procedure, initial encounter: Secondary | ICD-10-CM | POA: Diagnosis not present

## 2016-07-23 LAB — C-REACTIVE PROTEIN: CRP: 83.4 mg/L — ABNORMAL HIGH (ref <8.0)

## 2016-07-23 LAB — SEDIMENTATION RATE: Sed Rate: 94 mm/hr — ABNORMAL HIGH (ref 0–15)

## 2016-07-24 LAB — AEROBIC/ANAEROBIC CULTURE (SURGICAL/DEEP WOUND): CULTURE: NORMAL

## 2016-07-24 LAB — AEROBIC/ANAEROBIC CULTURE W GRAM STAIN (SURGICAL/DEEP WOUND): Culture: NORMAL

## 2016-07-25 DIAGNOSIS — Z5181 Encounter for therapeutic drug level monitoring: Secondary | ICD-10-CM | POA: Diagnosis not present

## 2016-07-26 DIAGNOSIS — S83241D Other tear of medial meniscus, current injury, right knee, subsequent encounter: Secondary | ICD-10-CM | POA: Diagnosis not present

## 2016-07-27 DIAGNOSIS — T8189XA Other complications of procedures, not elsewhere classified, initial encounter: Secondary | ICD-10-CM | POA: Diagnosis not present

## 2016-07-27 DIAGNOSIS — T814XXA Infection following a procedure, initial encounter: Secondary | ICD-10-CM | POA: Diagnosis not present

## 2016-07-28 DIAGNOSIS — T8189XA Other complications of procedures, not elsewhere classified, initial encounter: Secondary | ICD-10-CM | POA: Diagnosis not present

## 2016-07-28 DIAGNOSIS — T814XXA Infection following a procedure, initial encounter: Secondary | ICD-10-CM | POA: Diagnosis not present

## 2016-07-29 ENCOUNTER — Encounter: Payer: Self-pay | Admitting: Internal Medicine

## 2016-07-29 DIAGNOSIS — Z5181 Encounter for therapeutic drug level monitoring: Secondary | ICD-10-CM | POA: Diagnosis not present

## 2016-07-29 DIAGNOSIS — T8189XA Other complications of procedures, not elsewhere classified, initial encounter: Secondary | ICD-10-CM | POA: Diagnosis not present

## 2016-07-29 DIAGNOSIS — T814XXA Infection following a procedure, initial encounter: Secondary | ICD-10-CM | POA: Diagnosis not present

## 2016-07-30 ENCOUNTER — Telehealth: Payer: Self-pay | Admitting: Pharmacist

## 2016-07-30 DIAGNOSIS — T814XXA Infection following a procedure, initial encounter: Secondary | ICD-10-CM | POA: Diagnosis not present

## 2016-07-30 DIAGNOSIS — T814XXD Infection following a procedure, subsequent encounter: Secondary | ICD-10-CM | POA: Diagnosis not present

## 2016-07-30 NOTE — Telephone Encounter (Signed)
Spoke with [name], [facility] pharmacist, about Terry Harper's vancomycin troughs. He was on 1750 mg IV q12h with a vanc trough of 5.7. He was increased to 2 gm IV q12h and his vanc trough was 5.8. Spoke with patient and he states that he has not missed a single dose. Spoke with Dr. Orvan Falconerampbell. Will switch patient to ceftaroline 600 mg IV q12h. Patient agrees and understands.  Also instructed him to stop taking the cipro. He follows up with Dr. Orvan Falconerampbell on 11/27.

## 2016-07-31 ENCOUNTER — Encounter: Payer: Self-pay | Admitting: Internal Medicine

## 2016-07-31 ENCOUNTER — Telehealth: Payer: Self-pay | Admitting: Pharmacist

## 2016-07-31 DIAGNOSIS — M150 Primary generalized (osteo)arthritis: Secondary | ICD-10-CM | POA: Diagnosis not present

## 2016-07-31 DIAGNOSIS — Z792 Long term (current) use of antibiotics: Secondary | ICD-10-CM | POA: Diagnosis not present

## 2016-07-31 DIAGNOSIS — T8189XA Other complications of procedures, not elsewhere classified, initial encounter: Secondary | ICD-10-CM | POA: Diagnosis not present

## 2016-07-31 DIAGNOSIS — Z452 Encounter for adjustment and management of vascular access device: Secondary | ICD-10-CM | POA: Diagnosis not present

## 2016-07-31 DIAGNOSIS — T8131XA Disruption of external operation (surgical) wound, not elsewhere classified, initial encounter: Secondary | ICD-10-CM | POA: Diagnosis not present

## 2016-07-31 DIAGNOSIS — I10 Essential (primary) hypertension: Secondary | ICD-10-CM | POA: Diagnosis not present

## 2016-07-31 DIAGNOSIS — F329 Major depressive disorder, single episode, unspecified: Secondary | ICD-10-CM | POA: Diagnosis not present

## 2016-07-31 DIAGNOSIS — Z79891 Long term (current) use of opiate analgesic: Secondary | ICD-10-CM | POA: Diagnosis not present

## 2016-07-31 DIAGNOSIS — L02415 Cutaneous abscess of right lower limb: Secondary | ICD-10-CM | POA: Diagnosis not present

## 2016-07-31 DIAGNOSIS — T814XXA Infection following a procedure, initial encounter: Secondary | ICD-10-CM | POA: Diagnosis not present

## 2016-07-31 NOTE — Telephone Encounter (Signed)
[facility].  [facility]. Faxed over prescription order from Dr. Orvan Falconerampbell. Called patient this morning and he went to ED last night to get first dose of ceftaroline.

## 2016-08-01 DIAGNOSIS — T814XXA Infection following a procedure, initial encounter: Secondary | ICD-10-CM | POA: Diagnosis not present

## 2016-08-01 DIAGNOSIS — T8189XA Other complications of procedures, not elsewhere classified, initial encounter: Secondary | ICD-10-CM | POA: Diagnosis not present

## 2016-08-02 DIAGNOSIS — T8131XA Disruption of external operation (surgical) wound, not elsewhere classified, initial encounter: Secondary | ICD-10-CM | POA: Diagnosis not present

## 2016-08-02 DIAGNOSIS — Z79891 Long term (current) use of opiate analgesic: Secondary | ICD-10-CM | POA: Diagnosis not present

## 2016-08-02 DIAGNOSIS — Z792 Long term (current) use of antibiotics: Secondary | ICD-10-CM | POA: Diagnosis not present

## 2016-08-02 DIAGNOSIS — T814XXA Infection following a procedure, initial encounter: Secondary | ICD-10-CM | POA: Diagnosis not present

## 2016-08-02 DIAGNOSIS — F329 Major depressive disorder, single episode, unspecified: Secondary | ICD-10-CM | POA: Diagnosis not present

## 2016-08-02 DIAGNOSIS — L02415 Cutaneous abscess of right lower limb: Secondary | ICD-10-CM | POA: Diagnosis not present

## 2016-08-02 DIAGNOSIS — Z452 Encounter for adjustment and management of vascular access device: Secondary | ICD-10-CM | POA: Diagnosis not present

## 2016-08-02 DIAGNOSIS — T8189XA Other complications of procedures, not elsewhere classified, initial encounter: Secondary | ICD-10-CM | POA: Diagnosis not present

## 2016-08-02 DIAGNOSIS — M150 Primary generalized (osteo)arthritis: Secondary | ICD-10-CM | POA: Diagnosis not present

## 2016-08-02 DIAGNOSIS — I10 Essential (primary) hypertension: Secondary | ICD-10-CM | POA: Diagnosis not present

## 2016-08-03 DIAGNOSIS — T814XXA Infection following a procedure, initial encounter: Secondary | ICD-10-CM | POA: Diagnosis not present

## 2016-08-03 DIAGNOSIS — T8189XA Other complications of procedures, not elsewhere classified, initial encounter: Secondary | ICD-10-CM | POA: Diagnosis not present

## 2016-08-05 ENCOUNTER — Ambulatory Visit (INDEPENDENT_AMBULATORY_CARE_PROVIDER_SITE_OTHER): Payer: BLUE CROSS/BLUE SHIELD | Admitting: Internal Medicine

## 2016-08-05 ENCOUNTER — Encounter: Payer: Self-pay | Admitting: Internal Medicine

## 2016-08-05 VITALS — BP 154/82 | HR 105 | Temp 98.6°F | Wt 224.0 lb

## 2016-08-05 DIAGNOSIS — T814XXD Infection following a procedure, subsequent encounter: Secondary | ICD-10-CM

## 2016-08-05 DIAGNOSIS — Z23 Encounter for immunization: Secondary | ICD-10-CM | POA: Diagnosis not present

## 2016-08-05 DIAGNOSIS — IMO0001 Reserved for inherently not codable concepts without codable children: Secondary | ICD-10-CM

## 2016-08-05 DIAGNOSIS — T814XXA Infection following a procedure, initial encounter: Secondary | ICD-10-CM | POA: Diagnosis not present

## 2016-08-05 NOTE — Assessment & Plan Note (Signed)
He is improving but still has elevated inflammatory markers and was never therapeutic with his IV vancomycin. I favor continuing ceftaroline for at least one more week. He will follow-up on 08/13/2016.

## 2016-08-05 NOTE — Progress Notes (Signed)
[facility]  Patient Active Problem List   Diagnosis Date Noted  . Postoperative wound infection 07/22/2016    Priority: High  . DJD (degenerative joint disease) 07/22/2016  . S/P ACL reconstruction 07/22/2016  . Cigar smoker 07/22/2016    Patient's Medications  New Prescriptions   No medications on file  Previous Medications   CEFTAROLINE (TEFLARO) 600 MG SOLR INJECTION    Inject 600 mg into the vein 2 (two) times daily.   CIPROFLOXACIN (CIPRO) 500 MG TABLET    Take 1 tablet (500 mg total) by mouth 2 (two) times daily.   LISDEXAMFETAMINE (VYVANSE) 70 MG CAPSULE    Take 70 mg by mouth daily.   LISINOPRIL (PRINIVIL,ZESTRIL) 20 MG TABLET    Take 20 mg by mouth daily.   LOVASTATIN (MEVACOR) 40 MG TABLET    Take 40 mg by mouth at bedtime.   MELOXICAM (MOBIC) 15 MG TABLET    Take 1 tablet by mouth daily as needed.   ONDANSETRON (ZOFRAN) 4 MG TABLET    Take 1 tablet (4 mg total) by mouth every 8 (eight) hours as needed for nausea or vomiting.   OXYCODONE-ACETAMINOPHEN (ROXICET) 5-325 MG TABLET    Take 1-2 tablets by mouth every 4 (four) hours as needed for severe pain.   TESTOSTERONE CYPIONATE (DEPOTESTOSTERONE CYPIONATE) 200 MG/ML INJECTION    Inject 0.75 mLs into the muscle once a week.   VENLAFAXINE (EFFEXOR) 75 MG TABLET    Take 75 mg by mouth every morning.  Modified Medications   No medications on file  Discontinued Medications   VANCOMYCIN (VANCOCIN) 1-5 GM/200ML-% SOLN    Inject 200 mLs (1,000 mg total) into the vein daily.    Subjective: Trip is in for his routine follow-up visit. He underwent right knee anterior cruciate ligament reconstruction on 06/17/2016. He developed a culture-negative postoperative wound abscess and underwent incision and drainage on 07/19/2016. He has been on IV antibiotics for the past 2 weeks. Initially he took IV vancomycin and oral ciprofloxacin but his vancomycin troughs never reached therapeutic levels. He was  switched to ceftaroline 5 days ago. He has had no problems tolerating his PICC or antibiotic. He is feeling better. He no longer has subjective fevers. His right knee pain and swelling have improved. He continues to use the wound VAC. The wound is getting smaller but he continues to have moderate output of thick yellow fluid.  Review of Systems: Review of Systems  Constitutional: Negative for chills, diaphoresis and fever.  Gastrointestinal: Negative for abdominal pain, diarrhea, nausea and vomiting.  Musculoskeletal: Positive for joint pain.       Mild, aching right knee pain.  Skin: Negative for itching and rash.    Past Medical History:  Diagnosis Date  . Depression   . Hypertension     Social History  Substance Use Topics  . Smoking status: Light Tobacco Smoker    Packs/day: 0.25    Years: 15.00    Types: Cigars  . Smokeless tobacco: Never Used  . Alcohol use 1.2 oz/week    1 Cans of beer, 1 Shots of liquor per week    No family history on file.  Allergies  Allergen Reactions  . Prednisone Palpitations    Objective: Vitals:   08/05/16 1515  BP: (!) 154/82  Pulse: (!) 105  Temp: 98.6 F (37 C)  TempSrc: Oral  Weight: 224 lb (101.6 kg)   Body mass index is  28.76 kg/m.  Physical Exam  Constitutional: He is oriented to person, place, and time.  He is in good spirits.  Musculoskeletal: Normal range of motion. He exhibits tenderness. He exhibits no edema.  He has a VAC dressing over his anterior right knee wound. He has slight tenderness medially but no significant swelling, redness or warmth. He has good range of motion.  Neurological: He is alert and oriented to person, place, and time. Gait normal.  Skin: No rash noted.  Right arm PICC site looks good.  Psychiatric: Mood and affect normal.    Lab Results Sedimentation rate 08/05/2016: 84 C-reactive protein 08/05/2016: 34.6   Problem List Items Addressed This Visit      High   Postoperative wound  infection    He is improving but still has elevated inflammatory markers and was never therapeutic with his IV vancomycin. I favor continuing ceftaroline for at least one more week. He will follow-up on 08/13/2016.      Relevant Medications   ceftaroline (TEFLARO) 600 MG SOLR injection    Other Visit Diagnoses    Need for prophylactic vaccination and inoculation against influenza    -  Primary   Relevant Orders   Flu Vaccine QUAD 36+ mos IM (Fluarix & Fluzone Quad PF       [name] Emmalea Treanor, MD [facility] [phone] pager   [phone] cell 08/05/2016, 3:49 PM

## 2016-08-06 DIAGNOSIS — T814XXD Infection following a procedure, subsequent encounter: Secondary | ICD-10-CM | POA: Diagnosis not present

## 2016-08-07 DIAGNOSIS — F329 Major depressive disorder, single episode, unspecified: Secondary | ICD-10-CM | POA: Diagnosis not present

## 2016-08-07 DIAGNOSIS — Z792 Long term (current) use of antibiotics: Secondary | ICD-10-CM | POA: Diagnosis not present

## 2016-08-07 DIAGNOSIS — Z452 Encounter for adjustment and management of vascular access device: Secondary | ICD-10-CM | POA: Diagnosis not present

## 2016-08-07 DIAGNOSIS — T8189XA Other complications of procedures, not elsewhere classified, initial encounter: Secondary | ICD-10-CM | POA: Diagnosis not present

## 2016-08-07 DIAGNOSIS — Z79891 Long term (current) use of opiate analgesic: Secondary | ICD-10-CM | POA: Diagnosis not present

## 2016-08-07 DIAGNOSIS — I10 Essential (primary) hypertension: Secondary | ICD-10-CM | POA: Diagnosis not present

## 2016-08-07 DIAGNOSIS — T814XXA Infection following a procedure, initial encounter: Secondary | ICD-10-CM | POA: Diagnosis not present

## 2016-08-07 DIAGNOSIS — T8131XA Disruption of external operation (surgical) wound, not elsewhere classified, initial encounter: Secondary | ICD-10-CM | POA: Diagnosis not present

## 2016-08-07 DIAGNOSIS — M150 Primary generalized (osteo)arthritis: Secondary | ICD-10-CM | POA: Diagnosis not present

## 2016-08-07 DIAGNOSIS — L02415 Cutaneous abscess of right lower limb: Secondary | ICD-10-CM | POA: Diagnosis not present

## 2016-08-12 DIAGNOSIS — T814XXA Infection following a procedure, initial encounter: Secondary | ICD-10-CM | POA: Diagnosis not present

## 2016-08-13 ENCOUNTER — Encounter: Payer: Self-pay | Admitting: Internal Medicine

## 2016-08-13 ENCOUNTER — Telehealth: Payer: Self-pay | Admitting: Pharmacist

## 2016-08-13 ENCOUNTER — Ambulatory Visit (INDEPENDENT_AMBULATORY_CARE_PROVIDER_SITE_OTHER): Payer: BLUE CROSS/BLUE SHIELD | Admitting: Internal Medicine

## 2016-08-13 DIAGNOSIS — T814XXD Infection following a procedure, subsequent encounter: Secondary | ICD-10-CM | POA: Diagnosis not present

## 2016-08-13 DIAGNOSIS — IMO0001 Reserved for inherently not codable concepts without codable children: Secondary | ICD-10-CM

## 2016-08-13 MED ORDER — DOXYCYCLINE HYCLATE 100 MG PO TABS: 100.0000 mg | ORAL_TABLET | Freq: Two times a day (BID) | ORAL | 1 refills | Status: DC

## 2016-08-13 NOTE — Progress Notes (Signed)
[facility]  Patient Active Problem List   Diagnosis Date Noted  . Postoperative wound infection 07/22/2016    Priority: High  . DJD (degenerative joint disease) 07/22/2016  . S/P ACL reconstruction 07/22/2016  . Cigar smoker 07/22/2016    Patient's Medications  New Prescriptions   DOXYCYCLINE (VIBRA-TABS) 100 MG TABLET    Take 1 tablet (100 mg total) by mouth 2 (two) times daily.  Previous Medications   CIPROFLOXACIN (CIPRO) 500 MG TABLET    Take 1 tablet (500 mg total) by mouth 2 (two) times daily.   LISDEXAMFETAMINE (VYVANSE) 70 MG CAPSULE    Take 70 mg by mouth daily.   LISINOPRIL (PRINIVIL,ZESTRIL) 20 MG TABLET    Take 20 mg by mouth daily.   LOVASTATIN (MEVACOR) 40 MG TABLET    Take 40 mg by mouth at bedtime.   MELOXICAM (MOBIC) 15 MG TABLET    Take 1 tablet by mouth daily as needed.   ONDANSETRON (ZOFRAN) 4 MG TABLET    Take 1 tablet (4 mg total) by mouth every 8 (eight) hours as needed for nausea or vomiting.   OXYCODONE-ACETAMINOPHEN (ROXICET) 5-325 MG TABLET    Take 1-2 tablets by mouth every 4 (four) hours as needed for severe pain.   TESTOSTERONE CYPIONATE (DEPOTESTOSTERONE CYPIONATE) 200 MG/ML INJECTION    Inject 0.75 mLs into the muscle once a week.   VENLAFAXINE (EFFEXOR) 75 MG TABLET    Take 75 mg by mouth every morning.  Modified Medications   No medications on file  Discontinued Medications   CEFTAROLINE (TEFLARO) 600 MG SOLR INJECTION    Inject 600 mg into the vein 2 (two) times daily.    Subjective: Trip is in for his routine follow-up visit. He is now completed 3 weeks of IV/by mouth antibiotic therapy for his postoperative right knee infection following anterior cruciate ligament repair. He has been on IV ceftaroline for the past [redacted] weeks along with his oral ciprofloxacin. He is tolerating his antibiotics and PICC well. His wound is getting smaller and the VAC sponge is not nearly as big. He continues to put out about 100 mL  of thick white fluid daily. He is having very little pain.  Review of Systems: Review of Systems  Constitutional: Negative for chills, diaphoresis and fever.  Gastrointestinal: Negative for abdominal pain, diarrhea, nausea and vomiting.  Musculoskeletal: Positive for joint pain.  Skin: Negative for rash.    Past Medical History:  Diagnosis Date  . Depression   . Hypertension     Social History  Substance Use Topics  . Smoking status: Light Tobacco Smoker    Packs/day: 0.25    Years: 15.00    Types: Cigars  . Smokeless tobacco: Never Used  . Alcohol use 1.2 oz/week    1 Cans of beer, 1 Shots of liquor per week    No family history on file.  Allergies  Allergen Reactions  . Prednisone Palpitations    Objective: Vitals:   08/13/16 1512  BP: (!) 145/89  Pulse: 84  Temp: 98.6 F (37 C)  TempSrc: Oral  Weight: 228 lb (103.4 kg)  Height: 6\' 2"  (1.88 m)   Body mass index is 29.27 kg/m.  Physical Exam  Constitutional: He is oriented to person, place, and time.  He is in good spirits.  Musculoskeletal:  He has a VAC dressing over his knee anteriorly. There is no surrounding cellulitis or fluctuance. He has excellent  range of motion without pain.  Neurological: He is alert and oriented to person, place, and time.  Skin: No rash noted.  PICC site looks good.  Psychiatric: Mood and affect normal.    Lab Results Sedimentation rate 08/12/2016: 58 C-reactive protein 08/12/2016: 30.7   Problem List Items Addressed This Visit      High   Postoperative wound infection    He is improving clinically. His inflammatory markers are still elevated but coming down slowly. I talked him a very various treatment options including stopping antibiotics now changing ceftaroline to oral doxycycline and continue that along with ciprofloxacin, versus continuing his current treatment a little longer. We elected to have his PICC removed and change him to oral doxycycline and  ciprofloxacin and have him follow-up in 2 weeks.      Relevant Medications   doxycycline (VIBRA-TABS) 100 MG tablet       [name] Rayner Erman, MD [facility] [phone] pager   [phone] cell 08/13/2016, 3:39 PM

## 2016-08-13 NOTE — Assessment & Plan Note (Signed)
He is improving clinically. His inflammatory markers are still elevated but coming down slowly. I talked him a very various treatment options including stopping antibiotics now changing ceftaroline to oral doxycycline and continue that along with ciprofloxacin, versus continuing his current treatment a little longer. We elected to have his PICC removed and change him to oral doxycycline and ciprofloxacin and have him follow-up in 2 weeks.

## 2016-08-13 NOTE — Telephone Encounter (Signed)
Called [name], [facility] pharmacist, and gave verbal order per Dr. Orvan Falconerampbell to stop patient's ceftaroline and pull patient's PICC line. [name] verbalized understanding.

## 2016-08-14 ENCOUNTER — Encounter: Payer: Self-pay | Admitting: Internal Medicine

## 2016-08-14 DIAGNOSIS — T8189XA Other complications of procedures, not elsewhere classified, initial encounter: Secondary | ICD-10-CM | POA: Diagnosis not present

## 2016-08-16 DIAGNOSIS — T8131XA Disruption of external operation (surgical) wound, not elsewhere classified, initial encounter: Secondary | ICD-10-CM | POA: Diagnosis not present

## 2016-08-16 DIAGNOSIS — I10 Essential (primary) hypertension: Secondary | ICD-10-CM | POA: Diagnosis not present

## 2016-08-16 DIAGNOSIS — M150 Primary generalized (osteo)arthritis: Secondary | ICD-10-CM | POA: Diagnosis not present

## 2016-08-16 DIAGNOSIS — Z452 Encounter for adjustment and management of vascular access device: Secondary | ICD-10-CM | POA: Diagnosis not present

## 2016-08-16 DIAGNOSIS — T814XXA Infection following a procedure, initial encounter: Secondary | ICD-10-CM | POA: Diagnosis not present

## 2016-08-16 DIAGNOSIS — F329 Major depressive disorder, single episode, unspecified: Secondary | ICD-10-CM | POA: Diagnosis not present

## 2016-08-16 DIAGNOSIS — Z792 Long term (current) use of antibiotics: Secondary | ICD-10-CM | POA: Diagnosis not present

## 2016-08-16 DIAGNOSIS — Z79891 Long term (current) use of opiate analgesic: Secondary | ICD-10-CM | POA: Diagnosis not present

## 2016-08-16 DIAGNOSIS — L02415 Cutaneous abscess of right lower limb: Secondary | ICD-10-CM | POA: Diagnosis not present

## 2016-08-19 DIAGNOSIS — Z79891 Long term (current) use of opiate analgesic: Secondary | ICD-10-CM | POA: Diagnosis not present

## 2016-08-19 DIAGNOSIS — F329 Major depressive disorder, single episode, unspecified: Secondary | ICD-10-CM | POA: Diagnosis not present

## 2016-08-19 DIAGNOSIS — M150 Primary generalized (osteo)arthritis: Secondary | ICD-10-CM | POA: Diagnosis not present

## 2016-08-19 DIAGNOSIS — T814XXA Infection following a procedure, initial encounter: Secondary | ICD-10-CM | POA: Diagnosis not present

## 2016-08-19 DIAGNOSIS — T8131XA Disruption of external operation (surgical) wound, not elsewhere classified, initial encounter: Secondary | ICD-10-CM | POA: Diagnosis not present

## 2016-08-19 DIAGNOSIS — Z452 Encounter for adjustment and management of vascular access device: Secondary | ICD-10-CM | POA: Diagnosis not present

## 2016-08-19 DIAGNOSIS — Z792 Long term (current) use of antibiotics: Secondary | ICD-10-CM | POA: Diagnosis not present

## 2016-08-19 DIAGNOSIS — L02415 Cutaneous abscess of right lower limb: Secondary | ICD-10-CM | POA: Diagnosis not present

## 2016-08-19 DIAGNOSIS — I10 Essential (primary) hypertension: Secondary | ICD-10-CM | POA: Diagnosis not present

## 2016-08-21 DIAGNOSIS — L02415 Cutaneous abscess of right lower limb: Secondary | ICD-10-CM | POA: Diagnosis not present

## 2016-08-21 DIAGNOSIS — Z452 Encounter for adjustment and management of vascular access device: Secondary | ICD-10-CM | POA: Diagnosis not present

## 2016-08-21 DIAGNOSIS — T8189XA Other complications of procedures, not elsewhere classified, initial encounter: Secondary | ICD-10-CM | POA: Diagnosis not present

## 2016-08-21 DIAGNOSIS — I10 Essential (primary) hypertension: Secondary | ICD-10-CM | POA: Diagnosis not present

## 2016-08-21 DIAGNOSIS — T814XXA Infection following a procedure, initial encounter: Secondary | ICD-10-CM | POA: Diagnosis not present

## 2016-08-21 DIAGNOSIS — Z79891 Long term (current) use of opiate analgesic: Secondary | ICD-10-CM | POA: Diagnosis not present

## 2016-08-21 DIAGNOSIS — Z792 Long term (current) use of antibiotics: Secondary | ICD-10-CM | POA: Diagnosis not present

## 2016-08-21 DIAGNOSIS — T8131XA Disruption of external operation (surgical) wound, not elsewhere classified, initial encounter: Secondary | ICD-10-CM | POA: Diagnosis not present

## 2016-08-21 DIAGNOSIS — F329 Major depressive disorder, single episode, unspecified: Secondary | ICD-10-CM | POA: Diagnosis not present

## 2016-08-21 DIAGNOSIS — M150 Primary generalized (osteo)arthritis: Secondary | ICD-10-CM | POA: Diagnosis not present

## 2016-08-23 DIAGNOSIS — M150 Primary generalized (osteo)arthritis: Secondary | ICD-10-CM | POA: Diagnosis not present

## 2016-08-23 DIAGNOSIS — I10 Essential (primary) hypertension: Secondary | ICD-10-CM | POA: Diagnosis not present

## 2016-08-23 DIAGNOSIS — L02415 Cutaneous abscess of right lower limb: Secondary | ICD-10-CM | POA: Diagnosis not present

## 2016-08-23 DIAGNOSIS — Z792 Long term (current) use of antibiotics: Secondary | ICD-10-CM | POA: Diagnosis not present

## 2016-08-23 DIAGNOSIS — T8131XA Disruption of external operation (surgical) wound, not elsewhere classified, initial encounter: Secondary | ICD-10-CM | POA: Diagnosis not present

## 2016-08-23 DIAGNOSIS — F329 Major depressive disorder, single episode, unspecified: Secondary | ICD-10-CM | POA: Diagnosis not present

## 2016-08-23 DIAGNOSIS — Z452 Encounter for adjustment and management of vascular access device: Secondary | ICD-10-CM | POA: Diagnosis not present

## 2016-08-23 DIAGNOSIS — Z79891 Long term (current) use of opiate analgesic: Secondary | ICD-10-CM | POA: Diagnosis not present

## 2016-08-23 DIAGNOSIS — T814XXA Infection following a procedure, initial encounter: Secondary | ICD-10-CM | POA: Diagnosis not present

## 2016-08-26 DIAGNOSIS — Z452 Encounter for adjustment and management of vascular access device: Secondary | ICD-10-CM | POA: Diagnosis not present

## 2016-08-26 DIAGNOSIS — Z792 Long term (current) use of antibiotics: Secondary | ICD-10-CM | POA: Diagnosis not present

## 2016-08-26 DIAGNOSIS — Z79891 Long term (current) use of opiate analgesic: Secondary | ICD-10-CM | POA: Diagnosis not present

## 2016-08-26 DIAGNOSIS — I10 Essential (primary) hypertension: Secondary | ICD-10-CM | POA: Diagnosis not present

## 2016-08-26 DIAGNOSIS — T814XXA Infection following a procedure, initial encounter: Secondary | ICD-10-CM | POA: Diagnosis not present

## 2016-08-26 DIAGNOSIS — M150 Primary generalized (osteo)arthritis: Secondary | ICD-10-CM | POA: Diagnosis not present

## 2016-08-26 DIAGNOSIS — T8131XA Disruption of external operation (surgical) wound, not elsewhere classified, initial encounter: Secondary | ICD-10-CM | POA: Diagnosis not present

## 2016-08-26 DIAGNOSIS — F329 Major depressive disorder, single episode, unspecified: Secondary | ICD-10-CM | POA: Diagnosis not present

## 2016-08-26 DIAGNOSIS — T8189XA Other complications of procedures, not elsewhere classified, initial encounter: Secondary | ICD-10-CM | POA: Diagnosis not present

## 2016-08-26 DIAGNOSIS — L02415 Cutaneous abscess of right lower limb: Secondary | ICD-10-CM | POA: Diagnosis not present

## 2016-08-27 DIAGNOSIS — T814XXD Infection following a procedure, subsequent encounter: Secondary | ICD-10-CM | POA: Diagnosis not present

## 2016-08-28 ENCOUNTER — Encounter: Payer: Self-pay | Admitting: Internal Medicine

## 2016-08-28 ENCOUNTER — Ambulatory Visit (INDEPENDENT_AMBULATORY_CARE_PROVIDER_SITE_OTHER): Payer: BLUE CROSS/BLUE SHIELD | Admitting: Internal Medicine

## 2016-08-28 DIAGNOSIS — M150 Primary generalized (osteo)arthritis: Secondary | ICD-10-CM | POA: Diagnosis not present

## 2016-08-28 DIAGNOSIS — T814XXA Infection following a procedure, initial encounter: Secondary | ICD-10-CM | POA: Diagnosis not present

## 2016-08-28 DIAGNOSIS — I10 Essential (primary) hypertension: Secondary | ICD-10-CM | POA: Diagnosis not present

## 2016-08-28 DIAGNOSIS — Z452 Encounter for adjustment and management of vascular access device: Secondary | ICD-10-CM | POA: Diagnosis not present

## 2016-08-28 DIAGNOSIS — T814XXD Infection following a procedure, subsequent encounter: Secondary | ICD-10-CM | POA: Diagnosis not present

## 2016-08-28 DIAGNOSIS — Z792 Long term (current) use of antibiotics: Secondary | ICD-10-CM | POA: Diagnosis not present

## 2016-08-28 DIAGNOSIS — L02415 Cutaneous abscess of right lower limb: Secondary | ICD-10-CM | POA: Diagnosis not present

## 2016-08-28 DIAGNOSIS — T8131XA Disruption of external operation (surgical) wound, not elsewhere classified, initial encounter: Secondary | ICD-10-CM | POA: Diagnosis not present

## 2016-08-28 DIAGNOSIS — F329 Major depressive disorder, single episode, unspecified: Secondary | ICD-10-CM | POA: Diagnosis not present

## 2016-08-28 DIAGNOSIS — Z79891 Long term (current) use of opiate analgesic: Secondary | ICD-10-CM | POA: Diagnosis not present

## 2016-08-28 DIAGNOSIS — IMO0001 Reserved for inherently not codable concepts without codable children: Secondary | ICD-10-CM

## 2016-08-28 NOTE — Assessment & Plan Note (Signed)
He continues slow improvement on therapy for culture negative postoperative wound infection. I will repeat his inflammatory markers today and consider stopping antibiotics soon. He will follow-up in 4-5 weeks.

## 2016-08-28 NOTE — Progress Notes (Addendum)
[facility]  Patient Active Problem List   Diagnosis Date Noted  . Postoperative wound infection 07/22/2016    Priority: High  . DJD (degenerative joint disease) 07/22/2016  . S/P ACL reconstruction 07/22/2016  . Cigar smoker 07/22/2016    Patient's Medications  New Prescriptions   No medications on file  Previous Medications   ACETAMINOPHEN (TYLENOL) 500 MG TABLET    Take 500 mg by mouth every 6 (six) hours as needed.   CIPROFLOXACIN (CIPRO) 500 MG TABLET    Take 1 tablet (500 mg total) by mouth 2 (two) times daily.   DOXYCYCLINE (VIBRA-TABS) 100 MG TABLET    Take 1 tablet (100 mg total) by mouth 2 (two) times daily.   LISDEXAMFETAMINE (VYVANSE) 70 MG CAPSULE    Take 70 mg by mouth daily.   LISINOPRIL (PRINIVIL,ZESTRIL) 20 MG TABLET    Take 20 mg by mouth daily.   LOVASTATIN (MEVACOR) 40 MG TABLET    Take 40 mg by mouth at bedtime.   MELOXICAM (MOBIC) 15 MG TABLET    Take 1 tablet by mouth daily as needed.   TESTOSTERONE CYPIONATE (DEPOTESTOSTERONE CYPIONATE) 200 MG/ML INJECTION    Inject 0.75 mLs into the muscle once a week.   VENLAFAXINE (EFFEXOR) 75 MG TABLET    Take 75 mg by mouth every morning.  Modified Medications   No medications on file  Discontinued Medications   ONDANSETRON (ZOFRAN) 4 MG TABLET    Take 1 tablet (4 mg total) by mouth every 8 (eight) hours as needed for nausea or vomiting.   OXYCODONE-ACETAMINOPHEN (ROXICET) 5-325 MG TABLET    Take 1-2 tablets by mouth every 4 (four) hours as needed for severe pain.    Subjective: Terry Harper is in for his routine follow-up visit. Terry Harper is now completed 5 weeks of antibiotic therapy for his postoperative right knee infection following anterior cruciate ligament repair. Terry Harper has been on oral doxycycline and ciprofloxacin for the past 2 weeks. His wound is getting smaller and the VAC sponge was removed this morning. Terry Harper is having very little pain. Terry Harper has not had any problems tolerating his  antibiotics.  Review of Systems: Review of Systems  Constitutional: Negative for chills, diaphoresis and fever.  Gastrointestinal: Negative for abdominal pain, diarrhea, nausea and vomiting.  Musculoskeletal: Positive for joint pain.  Skin: Negative for rash.    Past Medical History:  Diagnosis Date  . Depression   . Hypertension     Social History  Substance Use Topics  . Smoking status: Light Tobacco Smoker    Packs/day: 0.25    Years: 15.00    Types: Cigars  . Smokeless tobacco: Never Used  . Alcohol use 1.2 oz/week    1 Cans of beer, 1 Shots of liquor per week    No family history on file.  Allergies  Allergen Reactions  . Prednisone Palpitations    Objective: Vitals:   08/28/16 1525  BP: 137/87  Pulse: (!) 101  Temp: 98.7 F (37.1 C)  TempSrc: Oral  Weight: 235 lb 8 oz (106.8 kg)   Body mass index is 30.24 kg/m.  Physical Exam  Constitutional: Terry Harper is oriented to person, place, and time.  Terry Harper is in good spirits.  Musculoskeletal:  Terry Harper has a clear dressing over his wound. Terry Harper has excellent range of motion without pain.  Neurological: Terry Harper is alert and oriented to person, place, and time.  Skin: No rash noted.  Psychiatric:  Mood and affect normal.    Lab Results Sedimentation rate 08/12/2016: 58 C-reactive protein 08/12/2016: 30.7   Problem List Items Addressed This Visit      High   Postoperative wound infection    Terry Harper continues slow improvement on therapy for culture negative postoperative wound infection. I will repeat his inflammatory markers today and consider stopping antibiotics soon. Terry Harper will follow-up in 4-5 weeks.      Relevant Orders   C-reactive protein   Sedimentation rate     Addendum: Sed Rate (mm/hr)  Date Value  08/28/2016 15  07/22/2016 94 (H)   CRP (mg/L)  Date Value  07/22/2016 83.4 (H)   His sedimentation rate has normalized so I instructed him to go ahead and stop taking ciprofloxacin and doxycycline.   [name]  [name], MD [facility] [phone] pager   [phone] cell 08/28/2016, 3:43 PM

## 2016-08-29 LAB — SEDIMENTATION RATE: Sed Rate: 15 mm/hr (ref 0–15)

## 2016-08-29 LAB — C-REACTIVE PROTEIN: CRP: 5.1 mg/L (ref <8.0)

## 2016-08-29 NOTE — Addendum Note (Signed)
Addended by: Cliffton AstersAMPBELL, Cable Fearn on: 08/29/2016 12:11 PM   Modules accepted: Orders

## 2016-08-30 DIAGNOSIS — Z79891 Long term (current) use of opiate analgesic: Secondary | ICD-10-CM | POA: Diagnosis not present

## 2016-08-30 DIAGNOSIS — T814XXA Infection following a procedure, initial encounter: Secondary | ICD-10-CM | POA: Diagnosis not present

## 2016-08-30 DIAGNOSIS — I10 Essential (primary) hypertension: Secondary | ICD-10-CM | POA: Diagnosis not present

## 2016-08-30 DIAGNOSIS — M150 Primary generalized (osteo)arthritis: Secondary | ICD-10-CM | POA: Diagnosis not present

## 2016-08-30 DIAGNOSIS — Z452 Encounter for adjustment and management of vascular access device: Secondary | ICD-10-CM | POA: Diagnosis not present

## 2016-08-30 DIAGNOSIS — T8131XA Disruption of external operation (surgical) wound, not elsewhere classified, initial encounter: Secondary | ICD-10-CM | POA: Diagnosis not present

## 2016-08-30 DIAGNOSIS — L02415 Cutaneous abscess of right lower limb: Secondary | ICD-10-CM | POA: Diagnosis not present

## 2016-08-30 DIAGNOSIS — F329 Major depressive disorder, single episode, unspecified: Secondary | ICD-10-CM | POA: Diagnosis not present

## 2016-08-30 DIAGNOSIS — Z792 Long term (current) use of antibiotics: Secondary | ICD-10-CM | POA: Diagnosis not present

## 2016-09-04 DIAGNOSIS — L02415 Cutaneous abscess of right lower limb: Secondary | ICD-10-CM | POA: Diagnosis not present

## 2016-09-04 DIAGNOSIS — Z792 Long term (current) use of antibiotics: Secondary | ICD-10-CM | POA: Diagnosis not present

## 2016-09-04 DIAGNOSIS — M150 Primary generalized (osteo)arthritis: Secondary | ICD-10-CM | POA: Diagnosis not present

## 2016-09-04 DIAGNOSIS — F329 Major depressive disorder, single episode, unspecified: Secondary | ICD-10-CM | POA: Diagnosis not present

## 2016-09-04 DIAGNOSIS — T814XXA Infection following a procedure, initial encounter: Secondary | ICD-10-CM | POA: Diagnosis not present

## 2016-09-04 DIAGNOSIS — T8131XA Disruption of external operation (surgical) wound, not elsewhere classified, initial encounter: Secondary | ICD-10-CM | POA: Diagnosis not present

## 2016-09-04 DIAGNOSIS — I10 Essential (primary) hypertension: Secondary | ICD-10-CM | POA: Diagnosis not present

## 2016-09-04 DIAGNOSIS — Z79891 Long term (current) use of opiate analgesic: Secondary | ICD-10-CM | POA: Diagnosis not present

## 2016-09-04 DIAGNOSIS — Z452 Encounter for adjustment and management of vascular access device: Secondary | ICD-10-CM | POA: Diagnosis not present

## 2016-09-06 DIAGNOSIS — Z792 Long term (current) use of antibiotics: Secondary | ICD-10-CM | POA: Diagnosis not present

## 2016-09-06 DIAGNOSIS — L02415 Cutaneous abscess of right lower limb: Secondary | ICD-10-CM | POA: Diagnosis not present

## 2016-09-06 DIAGNOSIS — T814XXA Infection following a procedure, initial encounter: Secondary | ICD-10-CM | POA: Diagnosis not present

## 2016-09-06 DIAGNOSIS — T8131XA Disruption of external operation (surgical) wound, not elsewhere classified, initial encounter: Secondary | ICD-10-CM | POA: Diagnosis not present

## 2016-09-06 DIAGNOSIS — F329 Major depressive disorder, single episode, unspecified: Secondary | ICD-10-CM | POA: Diagnosis not present

## 2016-09-06 DIAGNOSIS — Z79891 Long term (current) use of opiate analgesic: Secondary | ICD-10-CM | POA: Diagnosis not present

## 2016-09-06 DIAGNOSIS — M150 Primary generalized (osteo)arthritis: Secondary | ICD-10-CM | POA: Diagnosis not present

## 2016-09-06 DIAGNOSIS — I10 Essential (primary) hypertension: Secondary | ICD-10-CM | POA: Diagnosis not present

## 2016-09-06 DIAGNOSIS — Z452 Encounter for adjustment and management of vascular access device: Secondary | ICD-10-CM | POA: Diagnosis not present

## 2016-09-09 DIAGNOSIS — M150 Primary generalized (osteo)arthritis: Secondary | ICD-10-CM | POA: Diagnosis not present

## 2016-09-09 DIAGNOSIS — F329 Major depressive disorder, single episode, unspecified: Secondary | ICD-10-CM | POA: Diagnosis not present

## 2016-09-09 DIAGNOSIS — T8131XA Disruption of external operation (surgical) wound, not elsewhere classified, initial encounter: Secondary | ICD-10-CM | POA: Diagnosis not present

## 2016-09-09 DIAGNOSIS — Z452 Encounter for adjustment and management of vascular access device: Secondary | ICD-10-CM | POA: Diagnosis not present

## 2016-09-09 DIAGNOSIS — Z792 Long term (current) use of antibiotics: Secondary | ICD-10-CM | POA: Diagnosis not present

## 2016-09-09 DIAGNOSIS — Z79891 Long term (current) use of opiate analgesic: Secondary | ICD-10-CM | POA: Diagnosis not present

## 2016-09-09 DIAGNOSIS — L02415 Cutaneous abscess of right lower limb: Secondary | ICD-10-CM | POA: Diagnosis not present

## 2016-09-09 DIAGNOSIS — T814XXA Infection following a procedure, initial encounter: Secondary | ICD-10-CM | POA: Diagnosis not present

## 2016-09-09 DIAGNOSIS — I10 Essential (primary) hypertension: Secondary | ICD-10-CM | POA: Diagnosis not present

## 2016-09-10 DIAGNOSIS — T814XXD Infection following a procedure, subsequent encounter: Secondary | ICD-10-CM | POA: Diagnosis not present

## 2016-09-11 DIAGNOSIS — I10 Essential (primary) hypertension: Secondary | ICD-10-CM | POA: Diagnosis not present

## 2016-09-11 DIAGNOSIS — F329 Major depressive disorder, single episode, unspecified: Secondary | ICD-10-CM | POA: Diagnosis not present

## 2016-09-11 DIAGNOSIS — T814XXA Infection following a procedure, initial encounter: Secondary | ICD-10-CM | POA: Diagnosis not present

## 2016-09-11 DIAGNOSIS — Z452 Encounter for adjustment and management of vascular access device: Secondary | ICD-10-CM | POA: Diagnosis not present

## 2016-09-11 DIAGNOSIS — M150 Primary generalized (osteo)arthritis: Secondary | ICD-10-CM | POA: Diagnosis not present

## 2016-09-11 DIAGNOSIS — Z792 Long term (current) use of antibiotics: Secondary | ICD-10-CM | POA: Diagnosis not present

## 2016-09-11 DIAGNOSIS — L02415 Cutaneous abscess of right lower limb: Secondary | ICD-10-CM | POA: Diagnosis not present

## 2016-09-11 DIAGNOSIS — Z79891 Long term (current) use of opiate analgesic: Secondary | ICD-10-CM | POA: Diagnosis not present

## 2016-09-11 DIAGNOSIS — T8131XA Disruption of external operation (surgical) wound, not elsewhere classified, initial encounter: Secondary | ICD-10-CM | POA: Diagnosis not present

## 2016-09-13 DIAGNOSIS — Z792 Long term (current) use of antibiotics: Secondary | ICD-10-CM | POA: Diagnosis not present

## 2016-09-13 DIAGNOSIS — I10 Essential (primary) hypertension: Secondary | ICD-10-CM | POA: Diagnosis not present

## 2016-09-13 DIAGNOSIS — T814XXA Infection following a procedure, initial encounter: Secondary | ICD-10-CM | POA: Diagnosis not present

## 2016-09-13 DIAGNOSIS — Z79891 Long term (current) use of opiate analgesic: Secondary | ICD-10-CM | POA: Diagnosis not present

## 2016-09-13 DIAGNOSIS — T8131XA Disruption of external operation (surgical) wound, not elsewhere classified, initial encounter: Secondary | ICD-10-CM | POA: Diagnosis not present

## 2016-09-13 DIAGNOSIS — M150 Primary generalized (osteo)arthritis: Secondary | ICD-10-CM | POA: Diagnosis not present

## 2016-09-13 DIAGNOSIS — L02415 Cutaneous abscess of right lower limb: Secondary | ICD-10-CM | POA: Diagnosis not present

## 2016-09-13 DIAGNOSIS — F329 Major depressive disorder, single episode, unspecified: Secondary | ICD-10-CM | POA: Diagnosis not present

## 2016-09-13 DIAGNOSIS — Z452 Encounter for adjustment and management of vascular access device: Secondary | ICD-10-CM | POA: Diagnosis not present

## 2016-09-30 ENCOUNTER — Ambulatory Visit: Payer: BLUE CROSS/BLUE SHIELD | Admitting: Internal Medicine

## 2016-10-12 DIAGNOSIS — I10 Essential (primary) hypertension: Secondary | ICD-10-CM | POA: Diagnosis not present

## 2016-10-12 DIAGNOSIS — F909 Attention-deficit hyperactivity disorder, unspecified type: Secondary | ICD-10-CM | POA: Diagnosis not present

## 2016-10-12 DIAGNOSIS — E785 Hyperlipidemia, unspecified: Secondary | ICD-10-CM | POA: Diagnosis not present

## 2016-10-12 DIAGNOSIS — F329 Major depressive disorder, single episode, unspecified: Secondary | ICD-10-CM | POA: Diagnosis not present

## 2016-11-05 DIAGNOSIS — H60391 Other infective otitis externa, right ear: Secondary | ICD-10-CM | POA: Diagnosis not present

## 2016-11-05 DIAGNOSIS — H6691 Otitis media, unspecified, right ear: Secondary | ICD-10-CM | POA: Diagnosis not present

## 2016-11-26 DIAGNOSIS — E291 Testicular hypofunction: Secondary | ICD-10-CM | POA: Diagnosis not present

## 2016-12-17 DIAGNOSIS — T814XXD Infection following a procedure, subsequent encounter: Secondary | ICD-10-CM | POA: Diagnosis not present

## 2016-12-27 DIAGNOSIS — M1711 Unilateral primary osteoarthritis, right knee: Secondary | ICD-10-CM | POA: Diagnosis not present

## 2017-01-03 DIAGNOSIS — M1711 Unilateral primary osteoarthritis, right knee: Secondary | ICD-10-CM | POA: Diagnosis not present

## 2017-01-10 DIAGNOSIS — M1711 Unilateral primary osteoarthritis, right knee: Secondary | ICD-10-CM | POA: Diagnosis not present

## 2017-01-16 DIAGNOSIS — E291 Testicular hypofunction: Secondary | ICD-10-CM | POA: Diagnosis not present

## 2017-05-20 DIAGNOSIS — Z125 Encounter for screening for malignant neoplasm of prostate: Secondary | ICD-10-CM | POA: Diagnosis not present

## 2017-05-20 DIAGNOSIS — E291 Testicular hypofunction: Secondary | ICD-10-CM | POA: Diagnosis not present

## 2017-05-26 DIAGNOSIS — E291 Testicular hypofunction: Secondary | ICD-10-CM | POA: Diagnosis not present

## 2017-06-30 DIAGNOSIS — R6889 Other general symptoms and signs: Secondary | ICD-10-CM | POA: Diagnosis not present

## 2017-06-30 DIAGNOSIS — F329 Major depressive disorder, single episode, unspecified: Secondary | ICD-10-CM | POA: Diagnosis not present

## 2017-07-05 DIAGNOSIS — Z23 Encounter for immunization: Secondary | ICD-10-CM | POA: Diagnosis not present

## 2017-07-05 DIAGNOSIS — E291 Testicular hypofunction: Secondary | ICD-10-CM | POA: Diagnosis not present

## 2017-07-05 DIAGNOSIS — F909 Attention-deficit hyperactivity disorder, unspecified type: Secondary | ICD-10-CM | POA: Diagnosis not present

## 2017-07-05 DIAGNOSIS — E785 Hyperlipidemia, unspecified: Secondary | ICD-10-CM | POA: Diagnosis not present

## 2017-07-05 DIAGNOSIS — I10 Essential (primary) hypertension: Secondary | ICD-10-CM | POA: Diagnosis not present

## 2017-07-05 DIAGNOSIS — F329 Major depressive disorder, single episode, unspecified: Secondary | ICD-10-CM | POA: Diagnosis not present

## 2017-11-17 DIAGNOSIS — Z125 Encounter for screening for malignant neoplasm of prostate: Secondary | ICD-10-CM | POA: Diagnosis not present

## 2017-11-17 DIAGNOSIS — E291 Testicular hypofunction: Secondary | ICD-10-CM | POA: Diagnosis not present

## 2017-11-25 DIAGNOSIS — E291 Testicular hypofunction: Secondary | ICD-10-CM | POA: Diagnosis not present

## 2017-12-19 DIAGNOSIS — M1711 Unilateral primary osteoarthritis, right knee: Secondary | ICD-10-CM | POA: Diagnosis not present

## 2017-12-24 DIAGNOSIS — M1711 Unilateral primary osteoarthritis, right knee: Secondary | ICD-10-CM | POA: Diagnosis not present

## 2017-12-31 DIAGNOSIS — M1711 Unilateral primary osteoarthritis, right knee: Secondary | ICD-10-CM | POA: Diagnosis not present

## 2018-01-23 DIAGNOSIS — I10 Essential (primary) hypertension: Secondary | ICD-10-CM | POA: Diagnosis not present

## 2018-01-23 DIAGNOSIS — E785 Hyperlipidemia, unspecified: Secondary | ICD-10-CM | POA: Diagnosis not present

## 2018-01-23 DIAGNOSIS — F329 Major depressive disorder, single episode, unspecified: Secondary | ICD-10-CM | POA: Diagnosis not present

## 2018-01-23 DIAGNOSIS — F909 Attention-deficit hyperactivity disorder, unspecified type: Secondary | ICD-10-CM | POA: Diagnosis not present

## 2018-02-07 DIAGNOSIS — E291 Testicular hypofunction: Secondary | ICD-10-CM | POA: Diagnosis not present

## 2018-02-07 DIAGNOSIS — I10 Essential (primary) hypertension: Secondary | ICD-10-CM | POA: Diagnosis not present

## 2018-02-07 DIAGNOSIS — E785 Hyperlipidemia, unspecified: Secondary | ICD-10-CM | POA: Diagnosis not present

## 2018-03-05 DIAGNOSIS — Z01818 Encounter for other preprocedural examination: Secondary | ICD-10-CM | POA: Diagnosis not present

## 2018-03-06 DIAGNOSIS — H52223 Regular astigmatism, bilateral: Secondary | ICD-10-CM | POA: Diagnosis not present

## 2018-05-25 DIAGNOSIS — H52213 Irregular astigmatism, bilateral: Secondary | ICD-10-CM

## 2018-05-25 DIAGNOSIS — H2513 Age-related nuclear cataract, bilateral: Secondary | ICD-10-CM

## 2018-05-25 HISTORY — DX: Irregular astigmatism, bilateral: H52.213

## 2018-05-25 HISTORY — DX: Age-related nuclear cataract, bilateral: H25.13

## 2019-11-10 ENCOUNTER — Encounter: Payer: Self-pay | Admitting: Cardiology

## 2019-11-12 ENCOUNTER — Other Ambulatory Visit: Payer: Self-pay

## 2019-11-12 ENCOUNTER — Encounter: Payer: Self-pay | Admitting: Cardiology

## 2019-11-12 ENCOUNTER — Encounter (INDEPENDENT_AMBULATORY_CARE_PROVIDER_SITE_OTHER): Payer: Self-pay

## 2019-11-12 ENCOUNTER — Ambulatory Visit (INDEPENDENT_AMBULATORY_CARE_PROVIDER_SITE_OTHER): Payer: Commercial Managed Care - PPO | Admitting: Cardiology

## 2019-11-12 DIAGNOSIS — R072 Precordial pain: Secondary | ICD-10-CM

## 2019-11-12 DIAGNOSIS — E663 Overweight: Secondary | ICD-10-CM

## 2019-11-12 DIAGNOSIS — E782 Mixed hyperlipidemia: Secondary | ICD-10-CM

## 2019-11-12 DIAGNOSIS — R9431 Abnormal electrocardiogram [ECG] [EKG]: Secondary | ICD-10-CM | POA: Insufficient documentation

## 2019-11-12 DIAGNOSIS — R0789 Other chest pain: Secondary | ICD-10-CM

## 2019-11-12 HISTORY — DX: Mixed hyperlipidemia: E78.2

## 2019-11-12 HISTORY — DX: Other chest pain: R07.89

## 2019-11-12 HISTORY — DX: Abnormal electrocardiogram (ECG) (EKG): R94.31

## 2019-11-12 HISTORY — DX: Overweight: E66.3

## 2019-11-12 NOTE — Progress Notes (Signed)
Cardiology Office Note:    Date:  11/12/2019   ID:  Terry Harper, DOB 1965/11/05, MRN [id number]  PCP:  Paulina Fusi, MD  Cardiologist:  Garwin Brothers, MD   Referring MD: No ref. provider found    ASSESSMENT:    1. Abnormal EKG   2. Mixed dyslipidemia   3. Overweight   4. Chest tightness    PLAN:    In order of problems listed above:  1. Primary prevention stressed with the patient.  Importance of compliance with diet and medication stressed and he vocalized understanding.  I told him to lose weight.  He has abdominal obesity and he understands. 2. Mixed dyslipidemia: Diet was discussed.  Lipids followed by primary care physician. 3. Chest tightness and abnormal EKG: Patient will undergo echocardiogram to assess murmur heard on auscultation.  Also Lexiscan sestamibi will be done to assess his symptoms in view of risk factors.  He knows to go to the nearest emergency room for any concerning symptoms. 4. Patient will be seen in follow-up appointment in 2 months or earlier if the patient has any concerns    Medication Adjustments/Labs and Tests Ordered: Current medicines are reviewed at length with the patient today.  Concerns regarding medicines are outlined above.  No orders of the defined types were placed in this encounter.  No orders of the defined types were placed in this encounter.    History of Present Illness:    Terry Harper is a 54 y.o. male who is being seen today for the evaluation of abnormal EKG and chest tightness at the request of Dr. Fabio Asa.  Patient is a pleasant 54 year old male.  He has past medical history of mixed dyslipidemia.  He denies any problems at this time and takes care of activities of daily living.  He leads a sedentary lifestyle because of orthopedic issues involving his right knee.  Patient occasionally complains of chest tightness.  No orthopnea or PND.  He does not experience the symptoms on sexual activity.  At the time of my evaluation,  the patient is alert awake oriented and in no distress.  Past Medical History:  Diagnosis Date  . Adult ADHD   . Carpal tunnel syndrome, bilateral   . Controlled depression   . Depression   . Elevated hemoglobin (HCC)   . Ganglion cyst of right foot   . Hyperlipidemia   . Hypertension   . Male hypogonadism   . Sleep apnea in adult   . Venous insufficiency     Past Surgical History:  Procedure Laterality Date  . CARPAL TUNNEL RELEASE Right   . INCISION AND DRAINAGE Right 07/19/2016   Procedure: INCISION AND DRAINAGE;  Surgeon: Sheral Apley, MD;  Location: [facility];  Service: Orthopedics;  Laterality: Right;  . IR GENERIC HISTORICAL  07/22/2016   IR FLUORO GUIDE CV LINE RIGHT 07/22/2016 Brayton El, PA-C [facility]  . IR GENERIC HISTORICAL  07/22/2016   IR US GUIDE VASC ACCESS RIGHT 07/22/2016 Brayton El, PA-C [facility]  . KNEE ARTHROSCOPY Right 07/19/2016   Procedure: ARTHROSCOPY RIGHT KNEE WITH CONDROPLASTY;  Surgeon: Sheral Apley, MD;  Location: [facility];  Service: Orthopedics;  Laterality: Right;  . R Knee scope Right 06/17/2016  . SHOULDER SURGERY Left   . VASECTOMY      Current Medications: Current Meds  Medication Sig  . ALPRAZolam (XANAX) 0.5 MG tablet Take 0.5 mg by mouth 3 (three) times daily as needed for  anxiety.  Marland Kitchen amphetamine-dextroamphetamine (ADDERALL) 20 MG tablet Take 20 mg by mouth 2 (two) times daily.  Marland Kitchen aspirin EC 81 MG tablet Take 81 mg by mouth daily.  Marland Kitchen lisinopril (PRINIVIL,ZESTRIL) 20 MG tablet Take 20 mg by mouth daily.  Marland Kitchen lovastatin (MEVACOR) 40 MG tablet Take 40 mg by mouth at bedtime.  . meloxicam (MOBIC) 15 MG tablet Take 1 tablet by mouth daily as needed.  . sildenafil (VIAGRA) 100 MG tablet Take 100 mg by mouth as needed for erectile dysfunction.  Marland Kitchen testosterone cypionate (DEPOTESTOSTERONE CYPIONATE) 200 MG/ML injection Inject 0.75 mLs into the muscle once a week.  . venlafaxine XR  (EFFEXOR-XR) 150 MG 24 hr capsule Take 150 mg by mouth daily with breakfast.     Allergies:   Prednisone   Social History   Socioeconomic History  . Marital status: Single    Spouse name: Not on file  . Number of children: Not on file  . Years of education: Not on file  . Highest education level: Not on file  Occupational History  . Not on file  Tobacco Use  . Smoking status: Former Smoker    Packs/day: 0.25    Years: 15.00    Pack years: 3.75    Types: Cigars  . Smokeless tobacco: Never Used  Substance and Sexual Activity  . Alcohol use: Not Currently  . Drug use: No  . Sexual activity: Not on file  Other Topics Concern  . Not on file  Social History Narrative  . Not on file   Social Determinants of Health   Financial Resource Strain:   . Difficulty of Paying Living Expenses: Not on file  Food Insecurity:   . Worried About Programme researcher, broadcasting/film/video in the Last Year: Not on file  . Ran Out of Food in the Last Year: Not on file  Transportation Needs:   . Lack of Transportation (Medical): Not on file  . Lack of Transportation (Non-Medical): Not on file  Physical Activity:   . Days of Exercise per Week: Not on file  . Minutes of Exercise per Session: Not on file  Stress:   . Feeling of Stress : Not on file  Social Connections:   . Frequency of Communication with Friends and Family: Not on file  . Frequency of Social Gatherings with Friends and Family: Not on file  . Attends Religious Services: Not on file  . Active Member of Clubs or Organizations: Not on file  . Attends Banker Meetings: Not on file  . Marital Status: Not on file     Family History: The patient's family history includes Atrial fibrillation in his mother; Breast cancer in his mother; Heart attack in his father; Hyperlipidemia in his mother; Hypertension in his mother.  ROS:   Please see the history of present illness.    All other systems reviewed and are negative.  EKGs/Labs/Other  Studies Reviewed:    The following studies were reviewed today: EKG was sinus rhythm and T wave inversions in lateral leads.   Recent Labs: No results found for requested labs within last 8760 hours.  Recent Lipid Panel No results found for: CHOL, TRIG, HDL, CHOLHDL, VLDL, LDLCALC, LDLDIRECT  Physical Exam:    VS:  BP 128/70   Pulse 87   Ht 6\' 1"  (1.854 m)   Wt 257 lb (116.6 kg)   SpO2 95%   BMI 33.91 kg/m     Wt Readings from Last 3 Encounters:  11/12/19 257  lb (116.6 kg)  11/06/19 258 lb (117 kg)  08/28/16 235 lb 8 oz (106.8 kg)     GEN: Patient is in no acute distress HEENT: Normal NECK: No JVD; No carotid bruits LYMPHATICS: No lymphadenopathy CARDIAC: S1 S2 regular, 2/6 systolic murmur at the apex. RESPIRATORY:  Clear to auscultation without rales, wheezing or rhonchi  ABDOMEN: Soft, non-tender, non-distended MUSCULOSKELETAL:  No edema; No deformity  SKIN: Warm and dry NEUROLOGIC:  Alert and oriented x 3 PSYCHIATRIC:  Normal affect    Signed, Jenean Lindau, MD  11/12/2019 11:28 AM    [place]

## 2019-11-12 NOTE — Patient Instructions (Signed)
Medication Instructions:  No medication change *If you need a refill on your cardiac medications before your next appointment, please call your pharmacy*   Lab Work: None ordered If you have labs (blood work) drawn today and your tests are completely normal, you will receive your results only by: Marland Kitchen MyChart Message (if you have MyChart) OR . A paper copy in the mail If you have any lab test that is abnormal or we need to change your treatment, we will call you to review the results.   Testing/Procedures: Your physician has requested that you have an echocardiogram. Echocardiography is a painless test that uses sound waves to create images of your heart. It provides your doctor with information about the size and shape of your heart and how well your heart's chambers and valves are working. This procedure takes approximately one hour. There are no restrictions for this procedure.  Your physician has requested that you have a lexiscan myoview. For further information please visit [email or web address] Please follow instruction sheet, as given.  The test will take approximately 3 to 4 hours to complete; you may bring reading material.  If someone comes with you to your appointment, they will need to remain in the main lobby due to limited space in the testing area. **If you are pregnant or breastfeeding, please notify the nuclear lab prior to your appointment**  How to prepare for your Myocardial Perfusion Test: . Do not eat or drink 3 hours prior to your test, except you may have water. . Do not consume products containing caffeine (regular or decaffeinated) 12 hours prior to your test. (ex: coffee, chocolate, sodas, tea). . Do bring a list of your current medications with you.  If not listed below, you may take your medications as normal. . Do wear comfortable clothes (no dresses or overalls) and walking shoes, tennis shoes preferred (No heels or open toe shoes are allowed). . Do NOT wear  cologne, perfume, aftershave, or lotions (deodorant is allowed). . If these instructions are not followed, your test will have to be rescheduled.     Follow-Up: At [facility], you and your health needs are our priority.  As part of our continuing mission to provide you with exceptional heart care, we have created designated Provider Care Teams.  These Care Teams include your primary Cardiologist (physician) and Advanced Practice Providers (APPs -  Physician Assistants and Nurse Practitioners) who all work together to provide you with the care you need, when you need it.  We recommend signing up for the patient portal called "MyChart".  Sign up information is provided on this After Visit Summary.  MyChart is used to connect with patients for Virtual Visits (Telemedicine).  Patients are able to view lab/test results, encounter notes, upcoming appointments, etc.  Non-urgent messages can be sent to your provider as well.   To learn more about what you can do with MyChart, go to [email or web address].    Your next appointment:   2 month(s)  The format for your next appointment:   In Person  Provider:   Belva Crome, MD   Other Instructions NA

## 2019-11-24 ENCOUNTER — Telehealth (HOSPITAL_COMMUNITY): Payer: Self-pay | Admitting: *Deleted

## 2019-11-24 NOTE — Telephone Encounter (Signed)
Patient given detailed instructions per Myocardial Perfusion Study Information Sheet for the test on 12/02/19 at 7:45. Patient notified to arrive 15 minutes early and that it is imperative to arrive on time for appointment to keep from having the test rescheduled.  If you need to cancel or reschedule your appointment, please call the office within 24 hours of your appointment. . Patient verbalized understanding.Daneil Dolin

## 2019-12-02 ENCOUNTER — Ambulatory Visit (INDEPENDENT_AMBULATORY_CARE_PROVIDER_SITE_OTHER): Payer: Commercial Managed Care - PPO

## 2019-12-02 ENCOUNTER — Other Ambulatory Visit: Payer: Self-pay

## 2019-12-02 DIAGNOSIS — R072 Precordial pain: Secondary | ICD-10-CM | POA: Diagnosis not present

## 2019-12-02 DIAGNOSIS — R9431 Abnormal electrocardiogram [ECG] [EKG]: Secondary | ICD-10-CM | POA: Diagnosis not present

## 2019-12-02 LAB — MYOCARDIAL PERFUSION IMAGING
LV dias vol: 107 mL (ref 62–150)
LV sys vol: 34 mL
Peak HR: 104 {beats}/min
Rest HR: 67 {beats}/min
SDS: 5
SRS: 5
SSS: 10
TID: 0.94

## 2019-12-02 MED ORDER — TECHNETIUM TC 99M TETROFOSMIN IV KIT
29.7000 | PACK | Freq: Once | INTRAVENOUS | Status: AC | PRN
  Administered 2019-12-02: 29.7 via INTRAVENOUS

## 2019-12-02 MED ORDER — TECHNETIUM TC 99M TETROFOSMIN IV KIT
10.1000 | PACK | Freq: Once | INTRAVENOUS | Status: AC | PRN
  Administered 2019-12-02: 10.1 via INTRAVENOUS

## 2019-12-02 MED ORDER — REGADENOSON 0.4 MG/5ML IV SOLN
0.4000 mg | Freq: Once | INTRAVENOUS | Status: AC
  Administered 2019-12-02: 0.4 mg via INTRAVENOUS

## 2019-12-21 ENCOUNTER — Ambulatory Visit (INDEPENDENT_AMBULATORY_CARE_PROVIDER_SITE_OTHER): Payer: Commercial Managed Care - PPO

## 2019-12-21 ENCOUNTER — Other Ambulatory Visit: Payer: Self-pay

## 2019-12-21 DIAGNOSIS — R0789 Other chest pain: Secondary | ICD-10-CM

## 2019-12-21 DIAGNOSIS — R072 Precordial pain: Secondary | ICD-10-CM

## 2019-12-21 NOTE — Progress Notes (Signed)
Complete echocardiogram has been performed.  Jimmy Chrystopher Stangl RDCS, RVT 

## 2019-12-22 ENCOUNTER — Telehealth: Payer: Self-pay | Admitting: Emergency Medicine

## 2019-12-22 NOTE — Telephone Encounter (Signed)
Follow up  Patient is returning your call for test results. Please call. 

## 2019-12-22 NOTE — Telephone Encounter (Signed)
Patient informed of results.  

## 2019-12-22 NOTE — Telephone Encounter (Signed)
Left message for patient to return call regarding test results.  

## 2020-01-24 ENCOUNTER — Ambulatory Visit (INDEPENDENT_AMBULATORY_CARE_PROVIDER_SITE_OTHER): Payer: Commercial Managed Care - PPO | Admitting: Cardiology

## 2020-01-24 ENCOUNTER — Other Ambulatory Visit: Payer: Self-pay

## 2020-01-24 ENCOUNTER — Encounter: Payer: Self-pay | Admitting: Cardiology

## 2020-01-24 VITALS — BP 140/80 | HR 87 | Ht 73.0 in | Wt 260.0 lb

## 2020-01-24 DIAGNOSIS — R945 Abnormal results of liver function studies: Secondary | ICD-10-CM

## 2020-01-24 DIAGNOSIS — I10 Essential (primary) hypertension: Secondary | ICD-10-CM | POA: Insufficient documentation

## 2020-01-24 DIAGNOSIS — Z79899 Other long term (current) drug therapy: Secondary | ICD-10-CM

## 2020-01-24 DIAGNOSIS — E663 Overweight: Secondary | ICD-10-CM

## 2020-01-24 DIAGNOSIS — E782 Mixed hyperlipidemia: Secondary | ICD-10-CM | POA: Diagnosis not present

## 2020-01-24 HISTORY — DX: Essential (primary) hypertension: I10

## 2020-01-24 NOTE — Progress Notes (Signed)
Cardiology Office Note:    Date:  01/24/2020   ID:  Terry Harper, DOB [birth year], MRN [id number]  PCP:  Nicoletta Dress, MD  Cardiologist:  Jenean Lindau, MD   Referring MD: Nicoletta Dress, MD    ASSESSMENT:    No diagnosis found. PLAN:    In order of problems listed above:  1. Primary prevention stressed with the patient.  Importance of compliance with diet and medication stressed and he vocalized understanding.  Importance of regular exercise stressed and he was advised to walk at least half of an hour a day 5 days a week. 2. Essential hypertension: Blood pressure stable and diet was emphasized 3. Mixed dyslipidemia: Lipids from February were reviewed and elevated.  We will get blood work today.  He is considering wanting to go to a more potent statin.  I will do a calcium scoring to help understand his risk scoring and to further intensify lipid-lowering therapy. 4. Overweight: Weight reduction was stressed and I counseled him extensively about diet. 5. Patient will be seen in follow-up appointment in 6 months or earlier if the patient has any concerns    Medication Adjustments/Labs and Tests Ordered: Current medicines are reviewed at length with the patient today.  Concerns regarding medicines are outlined above.  No orders of the defined types were placed in this encounter.  No orders of the defined types were placed in this encounter.    Chief Complaint  Patient presents with  . Follow-up     History of Present Illness:    Terry Harper is a 54 y.o. male.  Patient has past medical history of essential hypertension and dyslipidemia.  He is overweight.  He denies any problems at this time and takes care of activities of daily living.  No chest pain orthopnea or PND.  He does not exercise much.  No chest pain orthopnea or PND.  At the time of my evaluation, the patient is alert awake oriented and in no distress.  Past Medical History:  Diagnosis Date  . Abnormal  EKG 11/12/2019  . Adult ADHD   . Carpal tunnel syndrome, bilateral   . Chest tightness 11/12/2019  . Cigar smoker 07/22/2016  . Controlled depression   . Depression   . DJD (degenerative joint disease) 07/22/2016  . Elevated hemoglobin ([place])   . Ganglion cyst of right foot   . Hyperlipidemia   . Hypertension   . Irregular astigmatism of both eyes 05/25/2018   S/p RK and subsequent PRK refractive surgery with irregular surface  . Male hypogonadism   . Mixed dyslipidemia 11/12/2019  . Nuclear sclerotic cataract of both eyes 05/25/2018  . Overweight 11/12/2019  . Postoperative wound infection 07/22/2016  . S/P ACL reconstruction 07/22/2016  . Sleep apnea in adult   . Venous insufficiency     Past Surgical History:  Procedure Laterality Date  . CARPAL TUNNEL RELEASE Right   . INCISION AND DRAINAGE Right 07/19/2016   Procedure: INCISION AND DRAINAGE;  Surgeon: Renette Butters, MD;  Location: [facility];  Service: Orthopedics;  Laterality: Right;  . IR GENERIC HISTORICAL  07/22/2016   IR FLUORO GUIDE CV LINE RIGHT 07/22/2016 Ascencion Dike, PA-C [facility]  . IR GENERIC HISTORICAL  07/22/2016   IR US GUIDE VASC ACCESS RIGHT 07/22/2016 Ascencion Dike, PA-C [facility]  . KNEE ARTHROSCOPY Right 07/19/2016   Procedure: ARTHROSCOPY RIGHT KNEE WITH CONDROPLASTY;  Surgeon: Renette Butters, MD;  Location: [facility];  Service: Orthopedics;  Laterality: Right;  . R Knee scope Right 06/17/2016  . SHOULDER SURGERY Left   . VASECTOMY      Current Medications: Current Meds  Medication Sig  . ALPRAZolam (XANAX) 0.5 MG tablet Take 0.5 mg by mouth 3 (three) times daily as needed for anxiety.  Marland Kitchen amphetamine-dextroamphetamine (ADDERALL) 20 MG tablet Take 20 mg by mouth 2 (two) times daily.  Marland Kitchen aspirin EC 81 MG tablet Take 81 mg by mouth daily.  Marland Kitchen lisinopril (PRINIVIL,ZESTRIL) 20 MG tablet Take 20 mg by mouth daily.  Marland Kitchen lovastatin (MEVACOR) 40 MG tablet Take 40 mg by  mouth at bedtime.  . meloxicam (MOBIC) 15 MG tablet Take 1 tablet by mouth daily as needed.  . sildenafil (VIAGRA) 100 MG tablet Take 100 mg by mouth as needed for erectile dysfunction.  Marland Kitchen testosterone cypionate (DEPOTESTOSTERONE CYPIONATE) 200 MG/ML injection Inject 0.75 mLs into the muscle once a week.  . venlafaxine XR (EFFEXOR-XR) 150 MG 24 hr capsule Take 150 mg by mouth daily with breakfast.     Allergies:   Prednisone   Social History   Socioeconomic History  . Marital status: Single    Spouse name: Not on file  . Number of children: Not on file  . Years of education: Not on file  . Highest education level: Not on file  Occupational History  . Not on file  Tobacco Use  . Smoking status: Former Smoker    Packs/day: 0.25    Years: 15.00    Pack years: 3.75    Types: Cigars  . Smokeless tobacco: Never Used  Substance and Sexual Activity  . Alcohol use: Not Currently  . Drug use: No  . Sexual activity: Not on file  Other Topics Concern  . Not on file  Social History Narrative  . Not on file   Social Determinants of Health   Financial Resource Strain:   . Difficulty of Paying Living Expenses:   Food Insecurity:   . Worried About Programme researcher, broadcasting/film/video in the Last Year:   . Barista in the Last Year:   Transportation Needs:   . Freight forwarder (Medical):   Marland Kitchen Lack of Transportation (Non-Medical):   Physical Activity:   . Days of Exercise per Week:   . Minutes of Exercise per Session:   Stress:   . Feeling of Stress :   Social Connections:   . Frequency of Communication with Friends and Family:   . Frequency of Social Gatherings with Friends and Family:   . Attends Religious Services:   . Active Member of Clubs or Organizations:   . Attends Banker Meetings:   Marland Kitchen Marital Status:      Family History: The patient's family history includes Atrial fibrillation in his mother; Breast cancer in his mother; Heart attack in his father;  Hyperlipidemia in his mother; Hypertension in his mother.  ROS:   Please see the history of present illness.    All other systems reviewed and are negative.  EKGs/Labs/Other Studies Reviewed:    The following studies were reviewed today: Study Highlights   The left ventricular ejection fraction is hyperdynamic (>65%).  Nuclear stress EF: 69%.  There was no ST segment deviation noted during stress.  The study is normal.  This is a low risk study.   IMPRESSIONS    1. Left ventricular ejection fraction, by estimation, is 60 to 65%. The  left ventricle has normal function. The left ventricle  has no regional  wall motion abnormalities. There is moderate concentric left ventricular  hypertrophy. The Left ventricle with  speckled pattern suggesting infiltrative disease. Left Ventricular  longitudinal strain not performed, suggest performing LV strain in  subsequent TTE.  2. Left ventricular diastolic parameters are consistent with Grade I  diastolic dysfunction (impaired relaxation).  3. Right ventricular systolic function is normal. The right ventricular  size is normal. There is normal pulmonary artery systolic pressure.  4. The mitral valve is normal in structure. No evidence of mitral valve  regurgitation. No evidence of mitral stenosis.  5. The aortic valve is normal in structure. Aortic valve regurgitation is  not visualized. No aortic stenosis is present.  6. The inferior vena cava is normal in size with greater than 50%  respiratory variability, suggesting right atrial pressure of 3 mmHg.    Recent Labs: No results found for requested labs within last 8760 hours.  Recent Lipid Panel No results found for: CHOL, TRIG, HDL, CHOLHDL, VLDL, LDLCALC, LDLDIRECT  Physical Exam:    VS:  BP 140/80 (BP Location: Left Arm, Patient Position: Sitting, Cuff Size: Large)   Pulse 87   Ht 6\' 1"  (1.854 m)   Wt 260 lb (117.9 kg)   SpO2 94%   BMI 34.30 kg/m     Wt  Readings from Last 3 Encounters:  01/24/20 260 lb (117.9 kg)  12/02/19 257 lb (116.6 kg)  11/12/19 257 lb (116.6 kg)     GEN: Patient is in no acute distress HEENT: Normal NECK: No JVD; No carotid bruits LYMPHATICS: No lymphadenopathy CARDIAC: Hear sounds regular, 2/6 systolic murmur at the apex. RESPIRATORY:  Clear to auscultation without rales, wheezing or rhonchi  ABDOMEN: Soft, non-tender, non-distended MUSCULOSKELETAL:  No edema; No deformity  SKIN: Warm and dry NEUROLOGIC:  Alert and oriented x 3 PSYCHIATRIC:  Normal affect   Signed, 01/12/20, MD  01/24/2020 8:58 AM    [facility]

## 2020-01-24 NOTE — Patient Instructions (Signed)
Medication Instructions:  No medication changes *If you need a refill on your cardiac medications before your next appointment, please call your pharmacy*   Lab Work: Your physician recommends that you have labs done today in the office. You had a  BMET, TSH, LFT and Lipids today.  If you have labs (blood work) drawn today and your tests are completely normal, you will receive your results only by: Marland Kitchen MyChart Message (if you have MyChart) OR . A paper copy in the mail If you have any lab test that is abnormal or we need to change your treatment, we will call you to review the results.   Testing/Procedures:  We will order CT coronary calcium score $150  Please call [phone] to schedule   [facility]  [address]  [place], [place] [address]    Follow-Up: At [facility], you and your health needs are our priority.  As part of our continuing mission to provide you with exceptional heart care, we have created designated Provider Care Teams.  These Care Teams include your primary Cardiologist (physician) and Advanced Practice Providers (APPs -  Physician Assistants and Nurse Practitioners) who all work together to provide you with the care you need, when you need it.  We recommend signing up for the patient portal called "MyChart".  Sign up information is provided on this After Visit Summary.  MyChart is used to connect with patients for Virtual Visits (Telemedicine).  Patients are able to view lab/test results, encounter notes, upcoming appointments, etc.  Non-urgent messages can be sent to your provider as well.   To learn more about what you can do with MyChart, go to [email or web address].    Your next appointment:   6 month(s)  The format for your next appointment:   In Person  Provider:   Belva Crome, MD   Other Instructions  Coronary Calcium Scan A coronary calcium scan is an imaging test used to look for deposits of plaque in the inner lining  of the blood vessels of the heart (coronary arteries). Plaque is made up of calcium, protein, and fatty substances. These deposits of plaque can partly clog and narrow the coronary arteries without producing any symptoms or warning signs. This puts a person at risk for a heart attack. This test is recommended for people who are at moderate risk for heart disease. The test can find plaque deposits before symptoms develop. Tell a health care provider about:  Any allergies you have.  All medicines you are taking, including vitamins, herbs, eye drops, creams, and over-the-counter medicines.  Any problems you or family members have had with anesthetic medicines.  Any blood disorders you have.  Any surgeries you have had.  Any medical conditions you have.  Whether you are pregnant or may be pregnant. What are the risks? Generally, this is a safe procedure. However, problems may occur, including:  Harm to a pregnant woman and her unborn baby. This test involves the use of radiation. Radiation exposure can be dangerous to a pregnant woman and her unborn baby. If you are pregnant or think you may be pregnant, you should not have this procedure done.  Slight increase in the risk of cancer. This is because of the radiation involved in the test. What happens before the procedure? Ask your health care provider for any specific instructions on how to prepare for this procedure. You may be asked to avoid products that contain caffeine, tobacco, or nicotine for 4 hours before  the procedure. What happens during the procedure?   You will undress and remove any jewelry from your neck or chest.  You will put on a hospital gown.  Sticky electrodes will be placed on your chest. The electrodes will be connected to an electrocardiogram (ECG) machine to record a tracing of the electrical activity of your heart.  You will lie down on a curved bed that is attached to the [place].  You may be given  medicine to slow down your heart rate so that clear pictures can be created.  You will be moved into the CT scanner, and the CT scanner will take pictures of your heart. During this time, you will be asked to lie still and hold your breath for 2-3 seconds at a time while each picture of your heart is being taken. The procedure may vary among health care providers and hospitals. What happens after the procedure?  You can get dressed.  You can return to your normal activities.  It is up to you to get the results of your procedure. Ask your health care provider, or the department that is doing the procedure, when your results will be ready. Summary  A coronary calcium scan is an imaging test used to look for deposits of plaque in the inner lining of the blood vessels of the heart (coronary arteries). Plaque is made up of calcium, protein, and fatty substances.  Generally, this is a safe procedure. Tell your health care provider if you are pregnant or may be pregnant.  Ask your health care provider for any specific instructions on how to prepare for this procedure.  A CT scanner will take pictures of your heart.  You can return to your normal activities after the scan is done. This information is not intended to replace advice given to you by your health care provider. Make sure you discuss any questions you have with your health care provider. Document Revised: 03/16/2019 Document Reviewed: 03/16/2019 Elsevier Patient Education  [employer].

## 2020-01-25 LAB — BASIC METABOLIC PANEL
BUN/Creatinine Ratio: 29 — ABNORMAL HIGH (ref 9–20)
BUN: 30 mg/dL — ABNORMAL HIGH (ref 6–24)
CO2: 24 mmol/L (ref 20–29)
Calcium: 9.7 mg/dL (ref 8.7–10.2)
Chloride: 98 mmol/L (ref 96–106)
Creatinine, Ser: 1.04 mg/dL (ref 0.76–1.27)
GFR calc Af Amer: 95 mL/min/{1.73_m2} (ref >59)
GFR calc non Af Amer: 82 mL/min/{1.73_m2} (ref >59)
Glucose: 96 mg/dL (ref 65–99)
Potassium: 4.8 mmol/L (ref 3.5–5.2)
Sodium: 141 mmol/L (ref 134–144)

## 2020-01-25 LAB — LIPID PANEL
Chol/HDL Ratio: 6.2 ratio — ABNORMAL HIGH (ref 0.0–5.0)
Cholesterol, Total: 216 mg/dL — ABNORMAL HIGH (ref 100–199)
HDL: 35 mg/dL — ABNORMAL LOW (ref >39)
LDL Chol Calc (NIH): 144 mg/dL — ABNORMAL HIGH (ref 0–99)
Triglycerides: 202 mg/dL — ABNORMAL HIGH (ref 0–149)
VLDL Cholesterol Cal: 37 mg/dL (ref 5–40)

## 2020-01-25 LAB — HEPATIC FUNCTION PANEL
ALT: 55 IU/L — ABNORMAL HIGH (ref 0–44)
AST: 33 IU/L (ref 0–40)
Albumin: 4.4 g/dL (ref 3.8–4.9)
Alkaline Phosphatase: 93 IU/L (ref 48–121)
Bilirubin Total: 0.7 mg/dL (ref 0.0–1.2)
Bilirubin, Direct: 0.15 mg/dL (ref 0.00–0.40)
Total Protein: 6.9 g/dL (ref 6.0–8.5)

## 2020-01-25 LAB — TSH: TSH: 1.91 u[IU]/mL (ref 0.450–4.500)

## 2020-01-26 ENCOUNTER — Other Ambulatory Visit: Payer: Self-pay

## 2020-01-26 DIAGNOSIS — R945 Abnormal results of liver function studies: Secondary | ICD-10-CM

## 2020-01-26 DIAGNOSIS — E782 Mixed hyperlipidemia: Secondary | ICD-10-CM

## 2020-01-26 NOTE — Addendum Note (Signed)
Addended by: Eleonore Chiquito on: 01/26/2020 02:27 PM   Modules accepted: Orders

## 2020-02-15 ENCOUNTER — Other Ambulatory Visit: Payer: Self-pay

## 2020-02-15 ENCOUNTER — Ambulatory Visit (INDEPENDENT_AMBULATORY_CARE_PROVIDER_SITE_OTHER)
Admission: RE | Admit: 2020-02-15 | Discharge: 2020-02-15 | Disposition: A | Discharge status: Home / Self Care | Payer: Self-pay | Source: Ambulatory Visit | Attending: Cardiology | Admitting: Cardiology

## 2020-02-15 DIAGNOSIS — I10 Essential (primary) hypertension: Secondary | ICD-10-CM

## 2020-02-16 NOTE — Progress Notes (Signed)
He needs to diet better and lose weight.  He has calcifications in the circumflex artery.  Change lovastatin to rosuvastatin 20 mg and liver lipid check in 6 weeks.  Before doing that please bring him in only for a Chem-7 and liver panel. Garwin Brothers, MD 02/16/2020 2:49 PM

## 2020-02-17 NOTE — Addendum Note (Signed)
Addended by: Eleonore Chiquito on: 02/17/2020 11:20 AM   Modules accepted: Orders

## 2020-02-18 LAB — BASIC METABOLIC PANEL
BUN/Creatinine Ratio: 31 — ABNORMAL HIGH (ref 9–20)
BUN: 32 mg/dL — ABNORMAL HIGH (ref 6–24)
CO2: 20 mmol/L (ref 20–29)
Calcium: 9 mg/dL (ref 8.7–10.2)
Chloride: 103 mmol/L (ref 96–106)
Creatinine, Ser: 1.02 mg/dL (ref 0.76–1.27)
GFR calc Af Amer: 97 mL/min/{1.73_m2} (ref >59)
GFR calc non Af Amer: 84 mL/min/{1.73_m2} (ref >59)
Glucose: 90 mg/dL (ref 65–99)
Potassium: 4.7 mmol/L (ref 3.5–5.2)
Sodium: 136 mmol/L (ref 134–144)

## 2020-02-18 LAB — HEPATIC FUNCTION PANEL
ALT: 41 IU/L (ref 0–44)
AST: 31 IU/L (ref 0–40)
Albumin: 4.1 g/dL (ref 3.8–4.9)
Alkaline Phosphatase: 87 IU/L (ref 48–121)
Bilirubin Total: 1.1 mg/dL (ref 0.0–1.2)
Bilirubin, Direct: 0.19 mg/dL (ref 0.00–0.40)
Total Protein: 6.8 g/dL (ref 6.0–8.5)

## 2020-02-22 MED ORDER — ROSUVASTATIN CALCIUM 20 MG PO TABS: 20.0000 mg | ORAL_TABLET | Freq: Every day | ORAL | 3 refills | Status: DC

## 2020-02-22 NOTE — Addendum Note (Signed)
Addended by: Eleonore Chiquito on: 02/22/2020 08:16 AM   Modules accepted: Orders

## 2020-04-13 DIAGNOSIS — M5412 Radiculopathy, cervical region: Secondary | ICD-10-CM | POA: Diagnosis not present

## 2020-04-18 DIAGNOSIS — E291 Testicular hypofunction: Secondary | ICD-10-CM | POA: Diagnosis not present

## 2020-04-18 DIAGNOSIS — R948 Abnormal results of function studies of other organs and systems: Secondary | ICD-10-CM | POA: Diagnosis not present

## 2020-04-24 DIAGNOSIS — E291 Testicular hypofunction: Secondary | ICD-10-CM | POA: Diagnosis not present

## 2020-04-24 DIAGNOSIS — N5201 Erectile dysfunction due to arterial insufficiency: Secondary | ICD-10-CM | POA: Diagnosis not present

## 2020-05-05 DIAGNOSIS — H1789 Other corneal scars and opacities: Secondary | ICD-10-CM | POA: Diagnosis not present

## 2020-05-26 ENCOUNTER — Other Ambulatory Visit: Payer: Self-pay

## 2020-05-26 ENCOUNTER — Other Ambulatory Visit: Payer: Self-pay | Admitting: Cardiology

## 2020-05-26 MED ORDER — ROSUVASTATIN CALCIUM 20 MG PO TABS: 20.0000 mg | ORAL_TABLET | Freq: Every day | ORAL | 3 refills | Status: DC

## 2020-05-26 NOTE — Telephone Encounter (Signed)
*  STAT* If patient is at the pharmacy, call can be transferred to refill team.   1. Which medications need to be refilled? (please list name of each medication and dose if known) Rosuvastatin 20 mg    2. Which pharmacy/location (including street and city if local pharmacy) is medication to be sent to? [facility] [phone]  3. Do they need a 30 day or 90 day supply? 90

## 2020-06-26 DIAGNOSIS — R6889 Other general symptoms and signs: Secondary | ICD-10-CM | POA: Diagnosis not present

## 2020-07-24 DIAGNOSIS — E782 Mixed hyperlipidemia: Secondary | ICD-10-CM | POA: Diagnosis not present

## 2020-07-24 DIAGNOSIS — R945 Abnormal results of liver function studies: Secondary | ICD-10-CM | POA: Diagnosis not present

## 2020-07-24 LAB — HEPATIC FUNCTION PANEL
ALT: 52 IU/L — ABNORMAL HIGH (ref 0–44)
AST: 33 IU/L (ref 0–40)
Albumin: 4.1 g/dL (ref 3.8–4.9)
Alkaline Phosphatase: 82 IU/L (ref 44–121)
Bilirubin Total: 0.8 mg/dL (ref 0.0–1.2)
Bilirubin, Direct: 0.19 mg/dL (ref 0.00–0.40)
Total Protein: 6.6 g/dL (ref 6.0–8.5)

## 2020-07-24 LAB — LIPID PANEL
Chol/HDL Ratio: 4.5 ratio (ref 0.0–5.0)
Cholesterol, Total: 143 mg/dL (ref 100–199)
HDL: 32 mg/dL — ABNORMAL LOW (ref >39)
LDL Chol Calc (NIH): 88 mg/dL (ref 0–99)
Triglycerides: 128 mg/dL (ref 0–149)
VLDL Cholesterol Cal: 23 mg/dL (ref 5–40)

## 2020-07-28 DIAGNOSIS — I10 Essential (primary) hypertension: Secondary | ICD-10-CM | POA: Insufficient documentation

## 2020-07-28 DIAGNOSIS — E291 Testicular hypofunction: Secondary | ICD-10-CM | POA: Insufficient documentation

## 2020-07-28 DIAGNOSIS — G5603 Carpal tunnel syndrome, bilateral upper limbs: Secondary | ICD-10-CM | POA: Insufficient documentation

## 2020-07-28 DIAGNOSIS — G4730 Sleep apnea, unspecified: Secondary | ICD-10-CM | POA: Insufficient documentation

## 2020-07-28 DIAGNOSIS — I872 Venous insufficiency (chronic) (peripheral): Secondary | ICD-10-CM | POA: Insufficient documentation

## 2020-07-28 DIAGNOSIS — F32A Depression, unspecified: Secondary | ICD-10-CM | POA: Insufficient documentation

## 2020-07-28 DIAGNOSIS — D582 Other hemoglobinopathies: Secondary | ICD-10-CM | POA: Insufficient documentation

## 2020-07-28 DIAGNOSIS — M67471 Ganglion, right ankle and foot: Secondary | ICD-10-CM | POA: Insufficient documentation

## 2020-07-28 DIAGNOSIS — F909 Attention-deficit hyperactivity disorder, unspecified type: Secondary | ICD-10-CM | POA: Insufficient documentation

## 2020-07-28 DIAGNOSIS — E785 Hyperlipidemia, unspecified: Secondary | ICD-10-CM | POA: Insufficient documentation

## 2020-07-31 ENCOUNTER — Other Ambulatory Visit: Payer: Self-pay

## 2020-07-31 ENCOUNTER — Encounter: Payer: Self-pay | Admitting: Cardiology

## 2020-07-31 ENCOUNTER — Ambulatory Visit: Payer: BC Managed Care – PPO | Admitting: Cardiology

## 2020-07-31 VITALS — BP 136/78 | HR 80 | Ht 74.0 in | Wt 261.2 lb

## 2020-07-31 DIAGNOSIS — E663 Overweight: Secondary | ICD-10-CM | POA: Diagnosis not present

## 2020-07-31 DIAGNOSIS — E782 Mixed hyperlipidemia: Secondary | ICD-10-CM | POA: Diagnosis not present

## 2020-07-31 DIAGNOSIS — I10 Essential (primary) hypertension: Secondary | ICD-10-CM | POA: Diagnosis not present

## 2020-07-31 DIAGNOSIS — R931 Abnormal findings on diagnostic imaging of heart and coronary circulation: Secondary | ICD-10-CM

## 2020-07-31 HISTORY — DX: Abnormal findings on diagnostic imaging of heart and coronary circulation: R93.1

## 2020-07-31 NOTE — Patient Instructions (Signed)
Medication Instructions:  No medication changes. *If you need a refill on your cardiac medications before your next appointment, please call your pharmacy*   Lab Work: Your physician recommends that you return for lab work in: 08/25/20 Your physician recommends that you return for lab work in: before your next visit. You need to have labs done when you are fasting.  You can come Monday through Friday 8:30 am to 12:00 pm and 1:15 to 4:30. You do not need to make an appointment as the order has already been placed. The labs you are going to have done are BMET, LFT and Lipids.  If you have labs (blood work) drawn today and your tests are completely normal, you will receive your results only by: Marland Kitchen MyChart Message (if you have MyChart) OR . A paper copy in the mail If you have any lab test that is abnormal or we need to change your treatment, we will call you to review the results.   Testing/Procedures: None ordered   Follow-Up: At [facility], you and your health needs are our priority.  As part of our continuing mission to provide you with exceptional heart care, we have created designated Provider Care Teams.  These Care Teams include your primary Cardiologist (physician) and Advanced Practice Providers (APPs -  Physician Assistants and Nurse Practitioners) who all work together to provide you with the care you need, when you need it.  We recommend signing up for the patient portal called "MyChart".  Sign up information is provided on this After Visit Summary.  MyChart is used to connect with patients for Virtual Visits (Telemedicine).  Patients are able to view lab/test results, encounter notes, upcoming appointments, etc.  Non-urgent messages can be sent to your provider as well.   To learn more about what you can do with MyChart, go to [email or web address].    Your next appointment:   6 month(s)  The format for your next appointment:   In Person  Provider:   Belva Crome,  MD   Other Instructions NA

## 2020-07-31 NOTE — Progress Notes (Signed)
Cardiology Office Note:    Date:  07/31/2020   ID:  Terry Harper, DOB [birth year], MRN [id number]  PCP:  Paulina Fusi, MD  Cardiologist:  Garwin Brothers, MD   Referring MD: Paulina Fusi, MD    ASSESSMENT:    1. Essential hypertension   2. Mixed hyperlipidemia   3. Overweight   4. Elevated coronary artery calcium score    PLAN:    In order of problems listed above:  1. Primary prevention stressed with the patient. Importance of compliance with diet medication stressed and patient vocalized understanding. 2. Essential hypertension: Blood pressure stable and diet was emphasized. Lifestyle modification and salt intake issues were discussed. 3. Mixed lipidemia: Tolerating rosuvastatin well and lipids have reduced significantly. Liver is mildly elevated and therefore we will recheck in a month. 4. Obesity: Weight reduction was stressed patient understands the risks and promises to do better. 5. Patient will be seen in follow-up appointment in 6 months or earlier if the patient has any concerns    Medication Adjustments/Labs and Tests Ordered: Current medicines are reviewed at length with the patient today.  Concerns regarding medicines are outlined above.  No orders of the defined types were placed in this encounter.  No orders of the defined types were placed in this encounter.    No chief complaint on file.    History of Present Illness:    Terry Harper is a 54 y.o. male. Patient has past medical history of essential hypertension dyslipidemia. Patient is overweight. Calcium artery score is elevated. He denies any problems at this time and takes care of activities of daily living. No chest pain orthopnea or PND. He tries to exercise on a regular basis but because of his schedule he mentions to me that sometimes he doesn't find much time. At the time of my evaluation, the patient is alert awake oriented and in no distress.  Past Medical History:  Diagnosis Date  .  Abnormal EKG 11/12/2019  . Adult ADHD   . Carpal tunnel syndrome, bilateral   . Chest tightness 11/12/2019  . Cigar smoker 07/22/2016  . Controlled depression   . Depression   . DJD (degenerative joint disease) 07/22/2016  . Elevated hemoglobin (HCC)   . Essential hypertension 01/24/2020  . Ganglion cyst of right foot   . Hyperlipidemia   . Hypertension   . Irregular astigmatism of both eyes 05/25/2018   S/p RK and subsequent PRK refractive surgery with irregular surface  . Male hypogonadism   . Mixed dyslipidemia 11/12/2019  . Nuclear sclerotic cataract of both eyes 05/25/2018  . Overweight 11/12/2019  . Postoperative wound infection 07/22/2016  . S/P ACL reconstruction 07/22/2016  . Sleep apnea in adult   . Venous insufficiency     Past Surgical History:  Procedure Laterality Date  . CARPAL TUNNEL RELEASE Right   . INCISION AND DRAINAGE Right 07/19/2016   Procedure: INCISION AND DRAINAGE;  Surgeon: Sheral Apley, MD;  Location: [facility];  Service: Orthopedics;  Laterality: Right;  . IR GENERIC HISTORICAL  07/22/2016   IR FLUORO GUIDE CV LINE RIGHT 07/22/2016 Brayton El, PA-C [facility]  . IR GENERIC HISTORICAL  07/22/2016   IR US GUIDE VASC ACCESS RIGHT 07/22/2016 Brayton El, PA-C [facility]  . KNEE ARTHROSCOPY Right 07/19/2016   Procedure: ARTHROSCOPY RIGHT KNEE WITH CONDROPLASTY;  Surgeon: Sheral Apley, MD;  Location:  SUR[facility]ERY C[facility];  Service: Orthopedics;  Laterality: Right;  . R Knee scope Right  06/17/2016  . SHOULDER SURGERY Left   . VASECTOMY      Current Medications: Current Meds  Medication Sig  . ALPRAZolam (XANAX) 0.5 MG tablet Take 0.5 mg by mouth 3 (three) times daily as needed for anxiety.  Marland Kitchen amphetamine-dextroamphetamine (ADDERALL) 20 MG tablet Take 20 mg by mouth 2 (two) times daily.  Marland Kitchen aspirin EC 81 MG tablet Take 81 mg by mouth daily.  Marland Kitchen lisinopril (PRINIVIL,ZESTRIL) 20 MG tablet Take 20 mg by mouth daily.  .  rosuvastatin (CRESTOR) 20 MG tablet Take 1 tablet (20 mg total) by mouth daily.  . sildenafil (VIAGRA) 100 MG tablet Take 100 mg by mouth as needed for erectile dysfunction.  Marland Kitchen testosterone cypionate (DEPOTESTOSTERONE CYPIONATE) 200 MG/ML injection Inject 0.75 mLs into the muscle once a week.  . venlafaxine XR (EFFEXOR-XR) 150 MG 24 hr capsule Take 150 mg by mouth daily with breakfast.     Allergies:   Prednisone   Social History   Socioeconomic History  . Marital status: Single    Spouse name: Not on file  . Number of children: Not on file  . Years of education: Not on file  . Highest education level: Not on file  Occupational History  . Not on file  Tobacco Use  . Smoking status: Former Smoker    Packs/day: 0.25    Years: 15.00    Pack years: 3.75    Types: Cigars  . Smokeless tobacco: Never Used  Substance and Sexual Activity  . Alcohol use: Not Currently  . Drug use: No  . Sexual activity: Not on file  Other Topics Concern  . Not on file  Social History Narrative  . Not on file   Social Determinants of Health   Financial Resource Strain:   . Difficulty of Paying Living Expenses: Not on file  Food Insecurity:   . Worried About Programme researcher, broadcasting/film/video in the Last Year: Not on file  . Ran Out of Food in the Last Year: Not on file  Transportation Needs:   . Lack of Transportation (Medical): Not on file  . Lack of Transportation (Non-Medical): Not on file  Physical Activity:   . Days of Exercise per Week: Not on file  . Minutes of Exercise per Session: Not on file  Stress:   . Feeling of Stress : Not on file  Social Connections:   . Frequency of Communication with Friends and Family: Not on file  . Frequency of Social Gatherings with Friends and Family: Not on file  . Attends Religious Services: Not on file  . Active Member of Clubs or Organizations: Not on file  . Attends Banker Meetings: Not on file  . Marital Status: Not on file     Family  History: The patient's family history includes Atrial fibrillation in his mother; Breast cancer in his mother; Heart attack in his father; Hyperlipidemia in his mother; Hypertension in his mother.  ROS:   Please see the history of present illness.    All other systems reviewed and are negative.  EKGs/Labs/Other Studies Reviewed:    The following studies were reviewed today: I discussed my findings with the patient at length including calcium coronary artery score and liver lipid check that was done recently.   Recent Labs: 01/24/2020: TSH 1.910 02/18/2020: BUN 32; Creatinine, Ser 1.02; Potassium 4.7; Sodium 136 07/24/2020: ALT 52  Recent Lipid Panel    Component Value Date/Time   CHOL 143 07/24/2020 0918   TRIG 128  07/24/2020 0918   HDL 32 (L) 07/24/2020 0918   CHOLHDL 4.5 07/24/2020 0918   LDLCALC 88 07/24/2020 0918    Physical Exam:    VS:  BP 136/78   Pulse 80   Ht 6\' 2"  (1.88 m)   Wt 261 lb 3.2 oz (118.5 kg)   SpO2 98%   BMI 33.54 kg/m     Wt Readings from Last 3 Encounters:  07/31/20 261 lb 3.2 oz (118.5 kg)  01/24/20 260 lb (117.9 kg)  12/02/19 257 lb (116.6 kg)     GEN: Patient is in no acute distress HEENT: Normal NECK: No JVD; No carotid bruits LYMPHATICS: No lymphadenopathy CARDIAC: Hear sounds regular, 2/6 systolic murmur at the apex. RESPIRATORY:  Clear to auscultation without rales, wheezing or rhonchi  ABDOMEN: Soft, non-tender, non-distended MUSCULOSKELETAL:  No edema; No deformity  SKIN: Warm and dry NEUROLOGIC:  Alert and oriented x 3 PSYCHIATRIC:  Normal affect   Signed, 12/04/19, MD  07/31/2020 8:26 AM    [facility]

## 2020-08-05 DIAGNOSIS — N529 Male erectile dysfunction, unspecified: Secondary | ICD-10-CM | POA: Diagnosis not present

## 2020-08-05 DIAGNOSIS — I10 Essential (primary) hypertension: Secondary | ICD-10-CM | POA: Diagnosis not present

## 2020-08-05 DIAGNOSIS — G4733 Obstructive sleep apnea (adult) (pediatric): Secondary | ICD-10-CM | POA: Diagnosis not present

## 2020-08-05 DIAGNOSIS — Z9989 Dependence on other enabling machines and devices: Secondary | ICD-10-CM | POA: Diagnosis not present

## 2021-01-22 ENCOUNTER — Other Ambulatory Visit: Payer: Self-pay

## 2021-01-22 ENCOUNTER — Ambulatory Visit (INDEPENDENT_AMBULATORY_CARE_PROVIDER_SITE_OTHER): Payer: BC Managed Care – PPO | Admitting: Cardiology

## 2021-01-22 ENCOUNTER — Encounter: Payer: Self-pay | Admitting: Cardiology

## 2021-01-22 VITALS — BP 130/66 | HR 80 | Ht 74.0 in | Wt 258.2 lb

## 2021-01-22 DIAGNOSIS — R931 Abnormal findings on diagnostic imaging of heart and coronary circulation: Secondary | ICD-10-CM | POA: Diagnosis not present

## 2021-01-22 DIAGNOSIS — E669 Obesity, unspecified: Secondary | ICD-10-CM

## 2021-01-22 DIAGNOSIS — E782 Mixed hyperlipidemia: Secondary | ICD-10-CM

## 2021-01-22 DIAGNOSIS — I10 Essential (primary) hypertension: Secondary | ICD-10-CM

## 2021-01-22 DIAGNOSIS — E66811 Obesity, class 1: Secondary | ICD-10-CM

## 2021-01-22 HISTORY — DX: Obesity, class 1: E66.811

## 2021-01-22 HISTORY — DX: Obesity, unspecified: E66.9

## 2021-01-22 LAB — LIPID PANEL
Chol/HDL Ratio: 4.5 ratio (ref 0.0–5.0)
Cholesterol, Total: 141 mg/dL (ref 100–199)
HDL: 31 mg/dL — ABNORMAL LOW (ref >39)
LDL Chol Calc (NIH): 94 mg/dL (ref 0–99)
Triglycerides: 84 mg/dL (ref 0–149)
VLDL Cholesterol Cal: 16 mg/dL (ref 5–40)

## 2021-01-22 LAB — HEPATIC FUNCTION PANEL
ALT: 41 IU/L (ref 0–44)
AST: 33 IU/L (ref 0–40)
Albumin: 4.1 g/dL (ref 3.8–4.9)
Alkaline Phosphatase: 77 IU/L (ref 44–121)
Bilirubin Total: 0.7 mg/dL (ref 0.0–1.2)
Bilirubin, Direct: 0.19 mg/dL (ref 0.00–0.40)
Total Protein: 6.3 g/dL (ref 6.0–8.5)

## 2021-01-22 LAB — BASIC METABOLIC PANEL
BUN/Creatinine Ratio: 19 (ref 9–20)
BUN: 19 mg/dL (ref 6–24)
CO2: 22 mmol/L (ref 20–29)
Calcium: 9.3 mg/dL (ref 8.7–10.2)
Chloride: 103 mmol/L (ref 96–106)
Creatinine, Ser: 0.98 mg/dL (ref 0.76–1.27)
Glucose: 92 mg/dL (ref 65–99)
Potassium: 4.8 mmol/L (ref 3.5–5.2)
Sodium: 138 mmol/L (ref 134–144)
eGFR: 92 mL/min/{1.73_m2} (ref >59)

## 2021-01-22 NOTE — Progress Notes (Signed)
Cardiology Office Note:    Date:  01/22/2021   ID:  Terry Harper, DOB 1966-02-27, MRN [id number]  PCP:  Paulina Fusi, MD  Cardiologist:  Garwin Brothers, MD   Referring MD: Paulina Fusi, MD    ASSESSMENT:    1. Essential hypertension   2. Elevated coronary artery calcium score   3. Mixed dyslipidemia   4. Obesity (BMI 30.0-34.9)    PLAN:    In order of problems listed above:  1. Elevated calcium score and coronary arteries: Secondary prevention stressed with the patient.  Importance of compliance with diet medication stressed and he vocalized understanding. 2. Essential hypertension: Blood pressure stable and diet was emphasized.  Lifestyle modification urged I told him to walk at least half an hour a day 5 days a week and he promises to do so. 3. Mixed dyslipidemia: On statin therapy and doing well lipids from last evaluation were reviewed and they are significantly improved than previous study 4. Obesity: Weight reduction was stressed and patient promises to do better.  Risks of obesity explained to the patient. 5. Patient will be seen in follow-up appointment in 6 months or earlier if the patient has any concerns    Medication Adjustments/Labs and Tests Ordered: Current medicines are reviewed at length with the patient today.  Concerns regarding medicines are outlined above.  No orders of the defined types were placed in this encounter.  No orders of the defined types were placed in this encounter.    No chief complaint on file.    History of Present Illness:    Terry Harper is a 55 y.o. male.  Patient has past medical history of elevated calcium score, essential hypertension and dyslipidemia.  He denies any problems at this time and takes care of activities of daily living.  No chest pain orthopnea or PND.  At the time of my evaluation, the patient is alert awake oriented and in no distress.  Past Medical History:  Diagnosis Date  . Abnormal EKG 11/12/2019  .  Adult ADHD   . Carpal tunnel syndrome, bilateral   . Chest tightness 11/12/2019  . Cigar smoker 07/22/2016  . Controlled depression   . Depression   . DJD (degenerative joint disease) 07/22/2016  . Elevated coronary artery calcium score 07/31/2020  . Elevated hemoglobin (HCC)   . Essential hypertension 01/24/2020  . Ganglion cyst of right foot   . Hyperlipidemia   . Hypertension   . Irregular astigmatism of both eyes 05/25/2018   S/p RK and subsequent PRK refractive surgery with irregular surface  . Male hypogonadism   . Mixed dyslipidemia 11/12/2019  . Nuclear sclerotic cataract of both eyes 05/25/2018  . Overweight 11/12/2019  . Postoperative wound infection 07/22/2016  . S/P ACL reconstruction 07/22/2016  . Sleep apnea in adult   . Venous insufficiency     Past Surgical History:  Procedure Laterality Date  . CARPAL TUNNEL RELEASE Right   . INCISION AND DRAINAGE Right 07/19/2016   Procedure: INCISION AND DRAINAGE;  Surgeon: Sheral Apley, MD;  Location: [facility];  Service: Orthopedics;  Laterality: Right;  . IR GENERIC HISTORICAL  07/22/2016   IR FLUORO GUIDE CV LINE RIGHT 07/22/2016 Brayton El, PA-C [facility]  . IR GENERIC HISTORICAL  07/22/2016   IR US GUIDE VASC ACCESS RIGHT 07/22/2016 Brayton El, PA-C [facility]  . KNEE ARTHROSCOPY Right 07/19/2016   Procedure: ARTHROSCOPY RIGHT KNEE WITH CONDROPLASTY;  Surgeon: Sheral Apley, MD;  Location:  [facility];  Service: Orthopedics;  Laterality: Right;  . R Knee scope Right 06/17/2016  . SHOULDER SURGERY Left   . VASECTOMY      Current Medications: Current Meds  Medication Sig  . ALPRAZolam (XANAX) 0.5 MG tablet Take 0.5 mg by mouth 3 (three) times daily as needed for anxiety.  Marland Kitchen amphetamine-dextroamphetamine (ADDERALL) 20 MG tablet Take 20 mg by mouth 2 (two) times daily.  Marland Kitchen aspirin EC 81 MG tablet Take 81 mg by mouth daily.  Marland Kitchen lisinopril (PRINIVIL,ZESTRIL) 20 MG tablet Take 20  mg by mouth daily.  . rosuvastatin (CRESTOR) 20 MG tablet Take 20 mg by mouth daily.  . tadalafil (CIALIS) 5 MG tablet Take 5 mg by mouth daily.  Marland Kitchen testosterone cypionate (DEPOTESTOSTERONE CYPIONATE) 200 MG/ML injection Inject 0.75 mLs into the muscle once a week.  . venlafaxine XR (EFFEXOR-XR) 150 MG 24 hr capsule Take 150 mg by mouth daily with breakfast.     Allergies:   Prednisone   Social History   Socioeconomic History  . Marital status: Single    Spouse name: Not on file  . Number of children: Not on file  . Years of education: Not on file  . Highest education level: Not on file  Occupational History  . Not on file  Tobacco Use  . Smoking status: Former Smoker    Packs/day: 0.25    Years: 15.00    Pack years: 3.75    Types: Cigars  . Smokeless tobacco: Never Used  Substance and Sexual Activity  . Alcohol use: Not Currently  . Drug use: No  . Sexual activity: Not on file  Other Topics Concern  . Not on file  Social History Narrative  . Not on file   Social Determinants of Health   Financial Resource Strain: Not on file  Food Insecurity: Not on file  Transportation Needs: Not on file  Physical Activity: Not on file  Stress: Not on file  Social Connections: Not on file     Family History: The patient's family history includes Atrial fibrillation in his mother; Breast cancer in his mother; Heart attack in his father; Hyperlipidemia in his mother; Hypertension in his mother.  ROS:   Please see the history of present illness.    All other systems reviewed and are negative.  EKGs/Labs/Other Studies Reviewed:    The following studies were reviewed today: I discussed my findings with the patient at length.  EKG reveals sinus rhythm and nonspecific ST-T changes.   Recent Labs: 01/24/2020: TSH 1.910 02/18/2020: BUN 32; Creatinine, Ser 1.02; Potassium 4.7; Sodium 136 07/24/2020: ALT 52  Recent Lipid Panel    Component Value Date/Time   CHOL 143 07/24/2020  0918   TRIG 128 07/24/2020 0918   HDL 32 (L) 07/24/2020 0918   CHOLHDL 4.5 07/24/2020 0918   LDLCALC 88 07/24/2020 0918    Physical Exam:    VS:  BP 130/66   Pulse 80   Ht 6\' 2"  (1.88 m)   Wt 258 lb 3.2 oz (117.1 kg)   SpO2 95%   BMI 33.15 kg/m     Wt Readings from Last 3 Encounters:  01/22/21 258 lb 3.2 oz (117.1 kg)  07/31/20 261 lb 3.2 oz (118.5 kg)  01/24/20 260 lb (117.9 kg)     GEN: Patient is in no acute distress HEENT: Normal NECK: No JVD; No carotid bruits LYMPHATICS: No lymphadenopathy CARDIAC: Hear sounds regular, 2/6 systolic murmur at the apex. RESPIRATORY:  Clear  to auscultation without rales, wheezing or rhonchi  ABDOMEN: Soft, non-tender, non-distended MUSCULOSKELETAL:  No edema; No deformity  SKIN: Warm and dry NEUROLOGIC:  Alert and oriented x 3 PSYCHIATRIC:  Normal affect   Signed, Garwin Brothers, MD  01/22/2021 8:32 AM     [facility]

## 2021-01-22 NOTE — Patient Instructions (Signed)
Medication Instructions:  No medication changes. *If you need a refill on your cardiac medications before your next appointment, please call your pharmacy*   Lab Work: Your physician recommends that you have labs done in the office today. Your test included  basic metabolic panel, liver function and lipids.  If you have labs (blood work) drawn today and your tests are completely normal, you will receive your results only by: MyChart Message (if you have MyChart) OR A paper copy in the mail If you have any lab test that is abnormal or we need to change your treatment, we will call you to review the results.   Testing/Procedures: None ordered   Follow-Up: At [facility], you and your health needs are our priority.  As part of our continuing mission to provide you with exceptional heart care, we have created designated Provider Care Teams.  These Care Teams include your primary Cardiologist (physician) and Advanced Practice Providers (APPs -  Physician Assistants and Nurse Practitioners) who all work together to provide you with the care you need, when you need it.  We recommend signing up for the patient portal called "MyChart".  Sign up information is provided on this After Visit Summary.  MyChart is used to connect with patients for Virtual Visits (Telemedicine).  Patients are able to view lab/test results, encounter notes, upcoming appointments, etc.  Non-urgent messages can be sent to your provider as well.   To learn more about what you can do with MyChart, go to [email or web address].    Your next appointment:   6 month(s)  The format for your next appointment:   In Person  Provider:   Rajan Revankar, MD   Other Instructions NA  

## 2021-05-11 DIAGNOSIS — F419 Anxiety disorder, unspecified: Secondary | ICD-10-CM | POA: Diagnosis not present

## 2021-05-11 DIAGNOSIS — F909 Attention-deficit hyperactivity disorder, unspecified type: Secondary | ICD-10-CM | POA: Diagnosis not present

## 2021-05-19 ENCOUNTER — Other Ambulatory Visit: Payer: Self-pay | Admitting: Cardiology

## 2021-05-21 NOTE — Telephone Encounter (Signed)
Rosuvastatin 20 mg # 90 x 3 refills sent to   [id number] - [place], [place] - [address]

## 2021-06-22 DIAGNOSIS — M5414 Radiculopathy, thoracic region: Secondary | ICD-10-CM | POA: Diagnosis not present

## 2021-06-22 DIAGNOSIS — M5412 Radiculopathy, cervical region: Secondary | ICD-10-CM | POA: Diagnosis not present

## 2021-07-04 ENCOUNTER — Other Ambulatory Visit: Payer: Self-pay

## 2021-07-04 MED ORDER — ROSUVASTATIN CALCIUM 20 MG PO TABS: 20.0000 mg | ORAL_TABLET | Freq: Every day | ORAL | 3 refills | Status: DC

## 2021-07-04 NOTE — Telephone Encounter (Signed)
Rosuvastatin sent to [facility] per pt request

## 2021-09-29 IMAGING — CT CT CARDIAC CORONARY ARTERY CALCIUM SCORE
3 series · 14 of 20 positions shown, 15 images · non-contrast
Comparison: None.
COMPARISON: None.

Addendum:
EXAM:
OVER-READ INTERPRETATION  CT CHEST

The following report is an over-read performed by radiologist Dr.
Vinu Berman [REDACTED] on 02/15/2020. This over-read
does not include interpretation of cardiac or coronary anatomy or
pathology. The coronary calcium score interpretation by the
cardiologist is attached.
CLINICAL DATA: 53M with hypertension and hyperlipidemia for risk
stratification
Coronary Calcium Score
TECHNIQUE: The patient was scanned on a Siemens Force scanner. Axial
non-contrast 3 mm slices were carried out through the heart. The
data set was analyzed on a dedicated work station and scored using
the Agatson method.

[Series 2: casc 3.0 bv41 2 bestsyst 37 % · axial · 0.38mm/px · z∈[+1156,+1231]mm · 4 of 43 slices shown, 5 images]
[im 9/43  vessel]
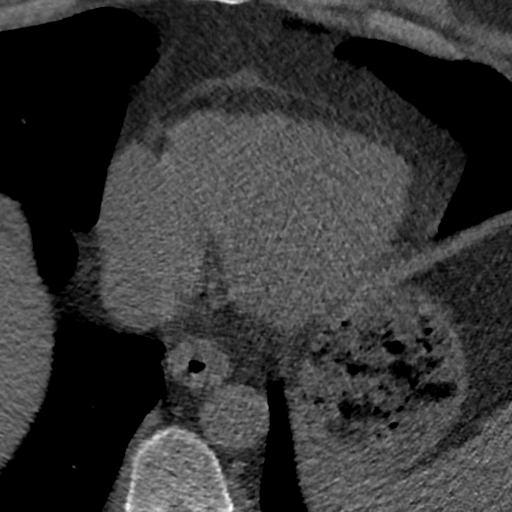
[im 9/43  lung]
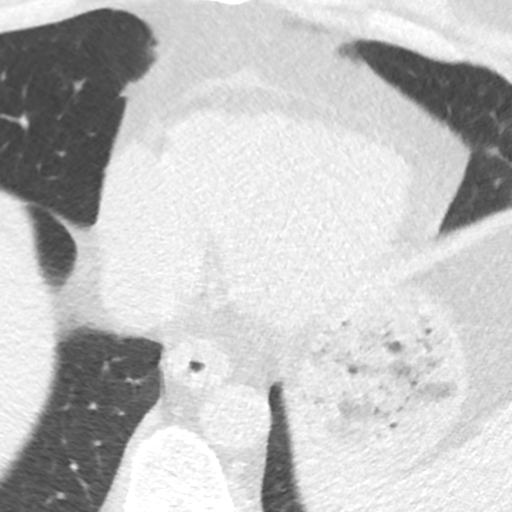
[im 17/43  vessel]
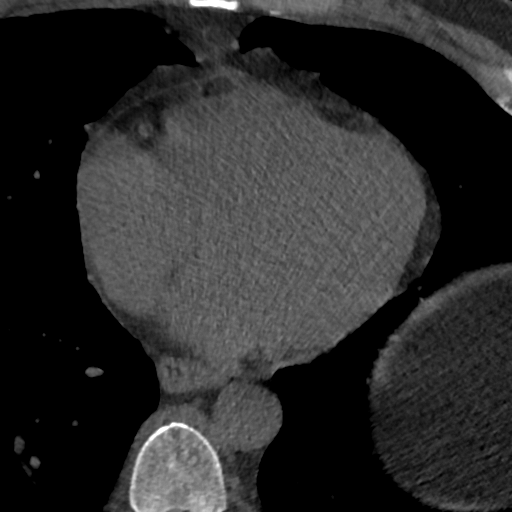
[im 26/43  vessel]
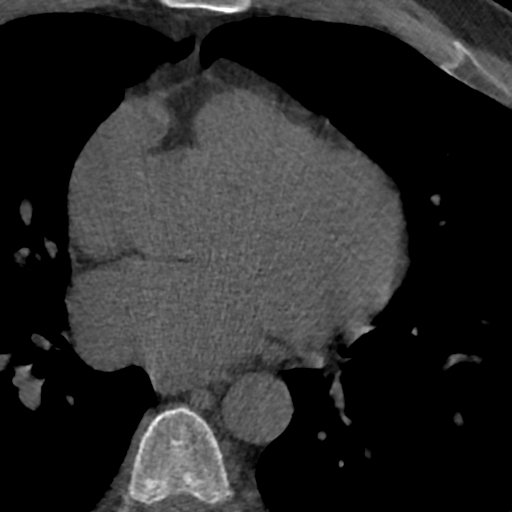
[im 34/43  vessel]
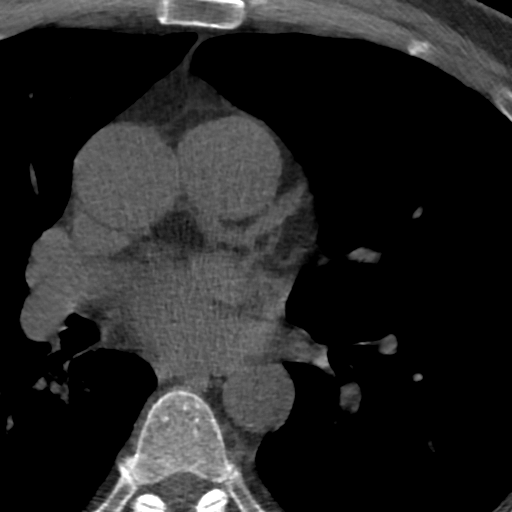

[Series 3: lung 38 % · axial · 0.77mm/px · z∈[+1152,+1236]mm · 5 of 43 slices shown]
[im 8/43  lung]
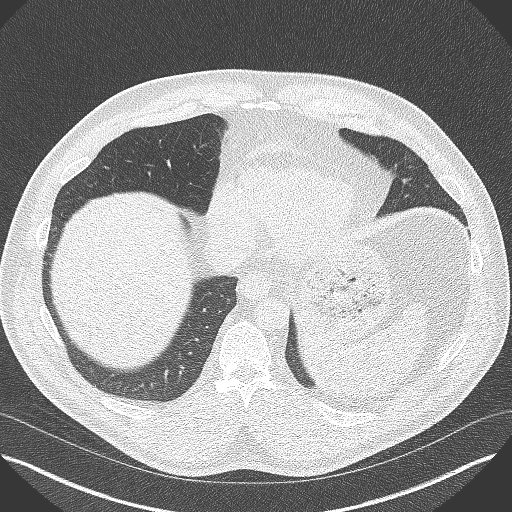
[im 15/43  lung]
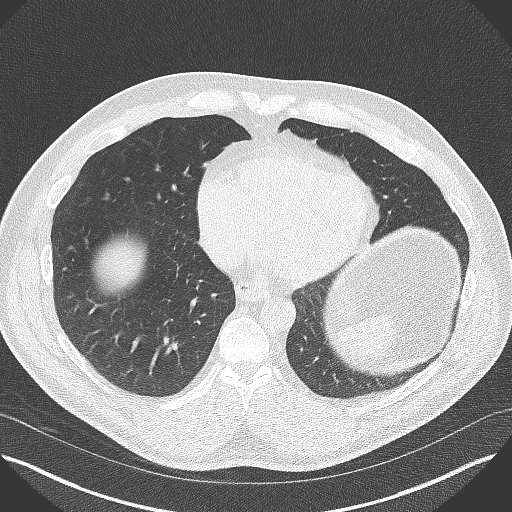
[im 22/43  lung]
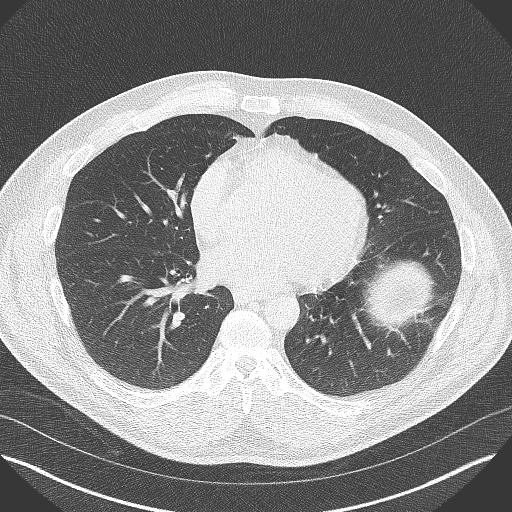
[im 29/43  lung]
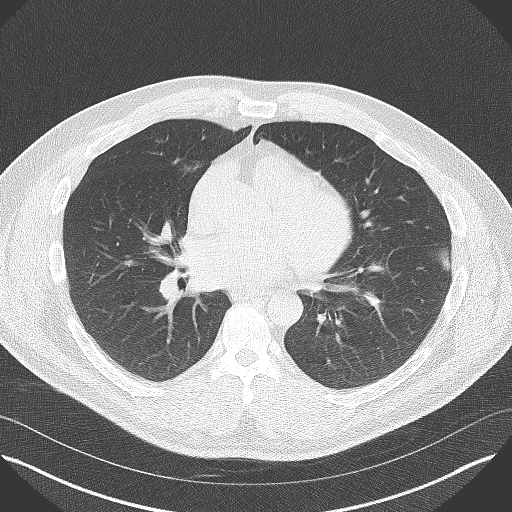
[im 36/43  lung]
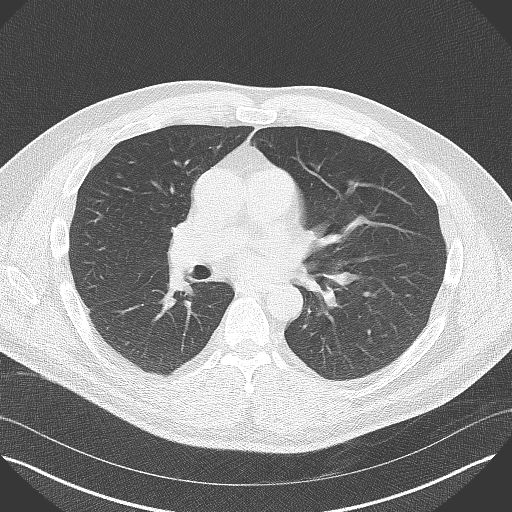

[Series 4: lung st 38 % · axial · 0.77mm/px · z∈[+1152,+1236]mm · 5 of 43 slices shown]
[im 8/43  lung]
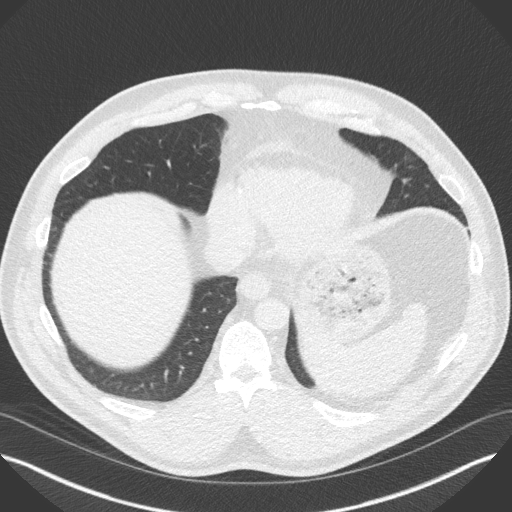
[im 15/43  lung]
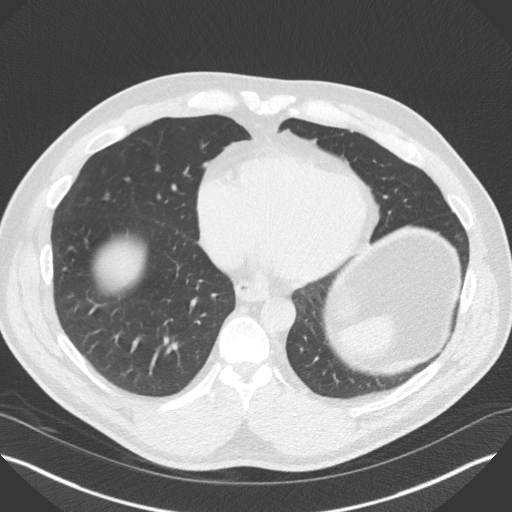
[im 22/43  lung]
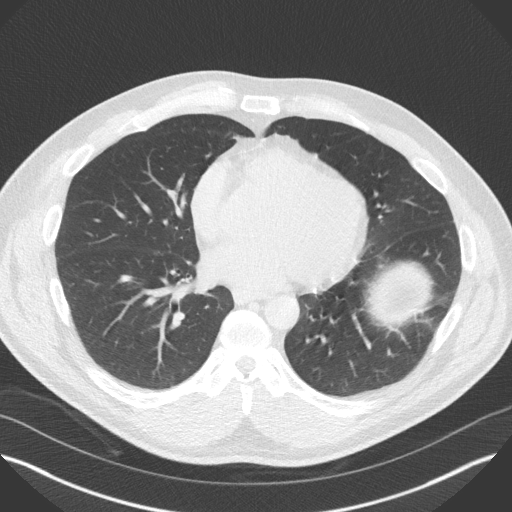
[im 29/43  lung]
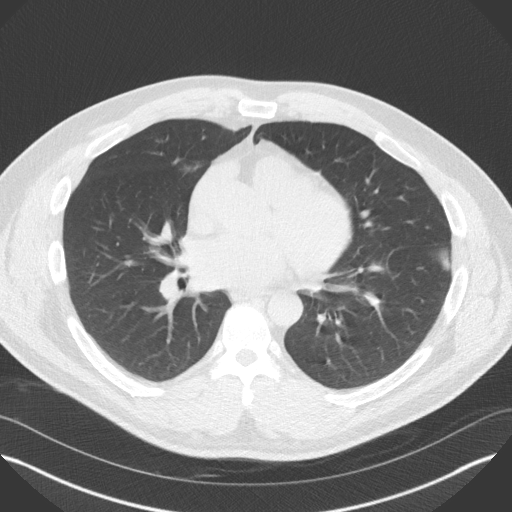
[im 36/43  lung]
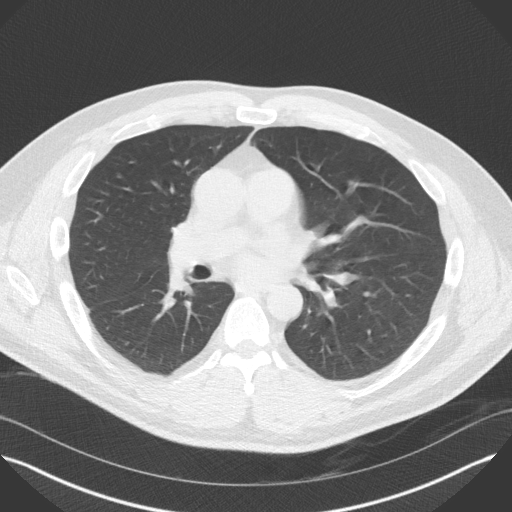

[14 of 20 positions shown; findings below may reference images not displayed]

FINDINGS: Vascular: Heart is normal size.  Visualized aorta normal caliber.

Mediastinum/Nodes: No adenopathy

Lungs/Pleura: Linear atelectasis or scarring at the left base.
Visualized lungs otherwise clear. No effusions.

Upper Abdomen: Imaging into the upper abdomen shows no acute
findings.

Musculoskeletal: Chest wall soft tissues are unremarkable. No acute
bony abnormality.
IMPRESSION: No acute or significant extracardiac abnormality.
FINDINGS: Non-cardiac: See separate report from [REDACTED].

Ascending Aorta: Mildly dilated.  3.8 cm.  No calcification.

Pericardium: Normal

Coronary arteries: Normal anatomy. Right dominant. Mild
calcification in the LCX territory.
IMPRESSION: Coronary calcium score of 89.2. This was 84th percentile for age and
sex matched control.

*** End of Addendum ***
EXAM:
OVER-READ INTERPRETATION  CT CHEST

The following report is an over-read performed by radiologist Dr.
Vinu Berman [REDACTED] on 02/15/2020. This over-read
does not include interpretation of cardiac or coronary anatomy or
pathology. The coronary calcium score interpretation by the
cardiologist is attached.
FINDINGS: Vascular: Heart is normal size.  Visualized aorta normal caliber.

Mediastinum/Nodes: No adenopathy

Lungs/Pleura: Linear atelectasis or scarring at the left base.
Visualized lungs otherwise clear. No effusions.

Upper Abdomen: Imaging into the upper abdomen shows no acute
findings.

Musculoskeletal: Chest wall soft tissues are unremarkable. No acute
bony abnormality.
IMPRESSION: No acute or significant extracardiac abnormality.

## 2021-11-12 DIAGNOSIS — I10 Essential (primary) hypertension: Secondary | ICD-10-CM | POA: Diagnosis not present

## 2021-11-12 DIAGNOSIS — R42 Dizziness and giddiness: Secondary | ICD-10-CM | POA: Diagnosis not present

## 2022-02-27 DIAGNOSIS — M5412 Radiculopathy, cervical region: Secondary | ICD-10-CM | POA: Diagnosis not present

## 2022-03-04 DIAGNOSIS — M542 Cervicalgia: Secondary | ICD-10-CM | POA: Diagnosis not present

## 2022-03-04 DIAGNOSIS — M4722 Other spondylosis with radiculopathy, cervical region: Secondary | ICD-10-CM | POA: Diagnosis not present

## 2022-03-04 DIAGNOSIS — M4312 Spondylolisthesis, cervical region: Secondary | ICD-10-CM | POA: Diagnosis not present

## 2022-03-20 DIAGNOSIS — M4802 Spinal stenosis, cervical region: Secondary | ICD-10-CM | POA: Diagnosis not present

## 2022-03-20 DIAGNOSIS — M542 Cervicalgia: Secondary | ICD-10-CM | POA: Diagnosis not present

## 2022-03-20 DIAGNOSIS — R202 Paresthesia of skin: Secondary | ICD-10-CM | POA: Diagnosis not present

## 2022-03-20 DIAGNOSIS — M4722 Other spondylosis with radiculopathy, cervical region: Secondary | ICD-10-CM | POA: Diagnosis not present

## 2022-03-20 DIAGNOSIS — R200 Anesthesia of skin: Secondary | ICD-10-CM | POA: Diagnosis not present

## 2022-04-16 DIAGNOSIS — M4722 Other spondylosis with radiculopathy, cervical region: Secondary | ICD-10-CM | POA: Diagnosis not present

## 2022-04-16 DIAGNOSIS — Z6835 Body mass index (BMI) 35.0-35.9, adult: Secondary | ICD-10-CM | POA: Diagnosis not present

## 2022-05-19 DIAGNOSIS — H66001 Acute suppurative otitis media without spontaneous rupture of ear drum, right ear: Secondary | ICD-10-CM | POA: Diagnosis not present

## 2022-05-19 DIAGNOSIS — R509 Fever, unspecified: Secondary | ICD-10-CM | POA: Diagnosis not present

## 2022-07-02 DIAGNOSIS — M4722 Other spondylosis with radiculopathy, cervical region: Secondary | ICD-10-CM | POA: Diagnosis not present

## 2022-10-18 DIAGNOSIS — M1711 Unilateral primary osteoarthritis, right knee: Secondary | ICD-10-CM | POA: Diagnosis not present

## 2022-10-21 ENCOUNTER — Telehealth: Payer: Self-pay

## 2022-10-21 NOTE — Telephone Encounter (Signed)
Pt has been scheduled with Dr. Geraldo Pitter for in office appt 10/30/22 @ 8 am. I will update all parties involved.

## 2022-10-21 NOTE — Telephone Encounter (Signed)
PRIMARY CARD IS DR. [facility]; I have reviewed schedules for [place] for an appt for the pt. I was not able to find an available appt. I am going to reach out the [place] office to see if they may please help make an appt for the pt for pre op clearance.

## 2022-10-21 NOTE — Telephone Encounter (Signed)
   Pre-operative Risk Assessment    Patient Name: Terry Harper  DOB: 18-May-1966 MRN: [id number]      Request for Surgical Clearance    Procedure:   right total knee replacement  Date of Surgery:  Clearance TBD                                 Surgeon:  Dr. Edmonia Lynch  Surgeon's Group or Practice Name:  [facility]  Phone number:  [phone] Fax number:  [phone]   Type of Clearance Requested:   - Medical    Type of Anesthesia:  Spinal   Additional requests/questions:    SignedLowella Grip   10/21/2022, 11:21 AM

## 2022-10-21 NOTE — Telephone Encounter (Signed)
   Name: Terry Harper  DOB: [birth year]  MRN: [id number]  Primary Cardiologist: None  Chart reviewed as part of pre-operative protocol coverage. Because of Terry Harper's past medical history and time since last visit, he will require a follow-up in-office visit in order to better assess preoperative cardiovascular risk.  Pre-op covering staff: - Please schedule appointment and call patient to inform them. If patient already had an upcoming appointment within acceptable timeframe, please add "pre-op clearance" to the appointment notes so provider is aware. - Please contact requesting surgeon's office via preferred method (i.e, phone, fax) to inform them of need for appointment prior to surgery.   No medications requested to be held.  This message will also be routed to pharmacy pool and/or Dr  for input on holding  as requested below so that this information is available to the clearing provider at time of patient's appointment.   Mable Fill, Marissa Nestle, NP  10/21/2022, 11:38 AM

## 2022-10-29 ENCOUNTER — Other Ambulatory Visit: Payer: Self-pay

## 2022-10-30 ENCOUNTER — Ambulatory Visit: Payer: 59 | Attending: Cardiology | Admitting: Cardiology

## 2022-10-30 ENCOUNTER — Encounter: Payer: Self-pay | Admitting: Cardiology

## 2022-10-30 VITALS — BP 128/68 | HR 86 | Ht 73.6 in | Wt 277.2 lb

## 2022-10-30 DIAGNOSIS — I10 Essential (primary) hypertension: Secondary | ICD-10-CM | POA: Diagnosis not present

## 2022-10-30 DIAGNOSIS — G4730 Sleep apnea, unspecified: Secondary | ICD-10-CM

## 2022-10-30 DIAGNOSIS — Z131 Encounter for screening for diabetes mellitus: Secondary | ICD-10-CM

## 2022-10-30 DIAGNOSIS — R931 Abnormal findings on diagnostic imaging of heart and coronary circulation: Secondary | ICD-10-CM

## 2022-10-30 DIAGNOSIS — Z1321 Encounter for screening for nutritional disorder: Secondary | ICD-10-CM

## 2022-10-30 DIAGNOSIS — Z01810 Encounter for preprocedural cardiovascular examination: Secondary | ICD-10-CM

## 2022-10-30 HISTORY — DX: Encounter for preprocedural cardiovascular examination: Z01.810

## 2022-10-30 NOTE — Patient Instructions (Signed)
Medication Instructions:  Your physician recommends that you continue on your current medications as directed. Please refer to the Current Medication list given to you today.  *If you need a refill on your cardiac medications before your next appointment, please call your pharmacy*   Lab Work: Your physician recommends that you have labs done in the office today. Your test included complete metabolic panel, complete blood count, TSH, A1C and lipids.  If you have labs (blood work) drawn today and your tests are completely normal, you will receive your results only by: [place] (if you have MyChart) OR A paper copy in the mail If you have any lab test that is abnormal or we need to change your treatment, we will call you to review the results.   Testing/Procedures: None ordered   Follow-Up: At [facility], you and your health needs are our priority.  As part of our continuing mission to provide you with exceptional heart care, we have created designated Provider Care Teams.  These Care Teams include your primary Cardiologist (physician) and Advanced Practice Providers (APPs -  Physician Assistants and Nurse Practitioners) who all work together to provide you with the care you need, when you need it.  We recommend signing up for the patient portal called "MyChart".  Sign up information is provided on this After Visit Summary.  MyChart is used to connect with patients for Virtual Visits (Telemedicine).  Patients are able to view lab/test results, encounter notes, upcoming appointments, etc.  Non-urgent messages can be sent to your provider as well.   To learn more about what you can do with MyChart, go to [email or web address].    Your next appointment:   12 month(s)  The format for your next appointment:   In Person  Provider:   Jyl Heinz, MD    Other Instructions none  Important Information About Sugar

## 2022-10-30 NOTE — Progress Notes (Signed)
Cardiology Office Note:    Date:  10/30/2022   ID:  Terry Harper, DOB 12/21/1965, MRN [id number]  PCP:  Nicoletta Dress, MD  Cardiologist:  Jenean Lindau, MD   Referring MD: Nicoletta Dress, MD    ASSESSMENT:    1. Preoperative cardiovascular examination   2. Essential hypertension   3. Elevated coronary artery calcium score   4. Sleep apnea in adult   5. Encounter for vitamin deficiency screening   6. Diabetes mellitus screening    PLAN:    In order of problems listed above:  Elevated calcium score, preoperative cardiovascular evaluation: Secondary prevention stressed to the patient.  Importance of compliance with diet medication stressed any vocalized understanding.  With activity as tolerated or with stress he has no chest pain as mentioned below and in view of this he is not at high risk for coronary events during the aforementioned surgery.  Meticulous hemodynamic monitoring will further reduce the risk of coronary events. Essential tension: Blood pressure is stable and diet was emphasized. Mixed dyslipidemia: Lipids not at goal.  He has not been very compliant with diet exercise and medications.  I will do complete blood work today.  Will also do hemoglobin A1c and vitamin D with history of deficiency. Sleep apnea: Sleep health issues were discussed. Obesity: Weight reduction stressed and diet was emphasized.  He promises to do better.  Risks of obesity explained. Patient will be seen in follow-up appointment in 9 months or earlier if the patient has any concerns    Medication Adjustments/Labs and Tests Ordered: Current medicines are reviewed at length with the patient today.  Concerns regarding medicines are outlined above.  Orders Placed This Encounter  Procedures   CBC   Comprehensive metabolic panel   Hemoglobin A1c   Lipid panel   TSH   VITAMIN D 25 Hydroxy (Vit-D Deficiency, Fractures)   EKG 12-Lead   No orders of the defined types were placed in this  encounter.    No chief complaint on file.    History of Present Illness:    Terry Harper is a 57 y.o. male.  Patient has past medical history of essential hypertension, dyslipidemia, obesity and right knee issues.  He also has history of sleep apnea.  He is here for preop evaluation for right knee replacement surgery.  He denies any chest pain orthopnea or PND.  He has limited ambulation because of the right knee problems.  Patient mentions to me that with stress or with sexual activity he has no chest pain.  No orthopnea or PND.  At the time of my evaluation, the patient is alert awake oriented and in no distress.  Past Medical History:  Diagnosis Date   Abnormal EKG 11/12/2019   Adult ADHD    Carpal tunnel syndrome, bilateral    Chest tightness 11/12/2019   Cigar smoker 07/22/2016   Controlled depression    Depression    DJD (degenerative joint disease) 07/22/2016   Elevated coronary artery calcium score 07/31/2020   Elevated hemoglobin (HCC)    Essential hypertension 01/24/2020   Ganglion cyst of right foot    Hyperlipidemia    Hypertension    Irregular astigmatism of both eyes 05/25/2018   S/p RK and subsequent Bowlegs refractive surgery with irregular surface   Male hypogonadism    Mixed dyslipidemia 11/12/2019   Nuclear sclerotic cataract of both eyes 05/25/2018   Obesity (BMI 30.0-34.9) 01/22/2021   Overweight 11/12/2019   Postoperative wound infection 07/22/2016  S/P ACL reconstruction 07/22/2016   Sleep apnea in adult    Venous insufficiency     Past Surgical History:  Procedure Laterality Date   CARPAL TUNNEL RELEASE Right    INCISION AND DRAINAGE Right 07/19/2016   Procedure: INCISION AND DRAINAGE;  Surgeon: Renette Butters, MD;  Location: [place];  Service: Orthopedics;  Laterality: Right;   IR GENERIC HISTORICAL  07/22/2016   IR FLUORO GUIDE CV LINE RIGHT 07/22/2016 Ascencion Dike, PA-C [facility]   IR GENERIC HISTORICAL  07/22/2016   IR US  GUIDE VASC ACCESS RIGHT 07/22/2016 Ascencion Dike, PA-C [facility]   KNEE ARTHROSCOPY Right 07/19/2016   Procedure: ARTHROSCOPY RIGHT KNEE WITH CONDROPLASTY;  Surgeon: Renette Butters, MD;  Location: [place];  Service: Orthopedics;  Laterality: Right;   R Knee scope Right 06/17/2016   SHOULDER SURGERY Left    VASECTOMY      Current Medications: Current Meds  Medication Sig   amphetamine-dextroamphetamine (ADDERALL) 20 MG tablet Take 20 mg by mouth 2 (two) times daily.   aspirin EC 81 MG tablet Take 81 mg by mouth daily.   gabapentin (NEURONTIN) 300 MG capsule Take 300 mg by mouth at bedtime.   lisinopril (PRINIVIL,ZESTRIL) 20 MG tablet Take 20 mg by mouth daily.   rosuvastatin (CRESTOR) 20 MG tablet Take 1 tablet (20 mg total) by mouth daily.   tadalafil (CIALIS) 5 MG tablet Take 5 mg by mouth daily.   venlafaxine XR (EFFEXOR-XR) 150 MG 24 hr capsule Take 150 mg by mouth daily with breakfast.     Allergies:   Prednisone   Social History   Socioeconomic History   Marital status: Single    Spouse name: Not on file   Number of children: Not on file   Years of education: Not on file   Highest education level: Not on file  Occupational History   Not on file  Tobacco Use   Smoking status: Former    Packs/day: 0.25    Years: 15.00    Total pack years: 3.75    Types: Cigars, Cigarettes   Smokeless tobacco: Never  Substance and Sexual Activity   Alcohol use: Not Currently   Drug use: No   Sexual activity: Not on file  Other Topics Concern   Not on file  Social History Narrative   Not on file   Social Determinants of Health   Financial Resource Strain: Not on file  Food Insecurity: Not on file  Transportation Needs: Not on file  Physical Activity: Not on file  Stress: Not on file  Social Connections: Not on file     Family History: The patient's family history includes Atrial fibrillation in his mother; Breast cancer in his mother; Heart attack in  his father; Hyperlipidemia in his mother; Hypertension in his mother.  ROS:   Please see the history of present illness.    All other systems reviewed and are negative.  EKGs/Labs/Other Studies Reviewed:    The following studies were reviewed today: I discussed my findings with the patient at length.  EKG reveals sinus rhythm and nonspecific ST-T changes.   Recent Labs: No results found for requested labs within last 365 days.  Recent Lipid Panel    Component Value Date/Time   CHOL 141 01/22/2021 0857   TRIG 84 01/22/2021 0857   HDL 31 (L) 01/22/2021 0857   CHOLHDL 4.5 01/22/2021 0857   LDLCALC 94 01/22/2021 0857    Physical Exam:  VS:  BP 128/68   Pulse 86   Ht 6' 1.6" (1.869 m)   Wt 277 lb 3.2 oz (125.7 kg)   SpO2 97%   BMI 35.98 kg/m     Wt Readings from Last 3 Encounters:  10/30/22 277 lb 3.2 oz (125.7 kg)  01/22/21 258 lb 3.2 oz (117.1 kg)  07/31/20 261 lb 3.2 oz (118.5 kg)     GEN: Patient is in no acute distress HEENT: Normal NECK: No JVD; No carotid bruits LYMPHATICS: No lymphadenopathy CARDIAC: Hear sounds regular, 2/6 systolic murmur at the apex. RESPIRATORY:  Clear to auscultation without rales, wheezing or rhonchi  ABDOMEN: Soft, non-tender, non-distended MUSCULOSKELETAL:  No edema; No deformity  SKIN: Warm and dry NEUROLOGIC:  Alert and oriented x 3 PSYCHIATRIC:  Normal affect   Signed, Jenean Lindau, MD  10/30/2022 8:19 AM    [place]

## 2022-10-31 LAB — COMPREHENSIVE METABOLIC PANEL
ALT: 51 IU/L — ABNORMAL HIGH (ref 0–44)
AST: 27 IU/L (ref 0–40)
Albumin/Globulin Ratio: 1.6 (ref 1.2–2.2)
Albumin: 4.1 g/dL (ref 3.8–4.9)
Alkaline Phosphatase: 90 IU/L (ref 44–121)
BUN/Creatinine Ratio: 26 — ABNORMAL HIGH (ref 9–20)
BUN: 24 mg/dL (ref 6–24)
Bilirubin Total: 0.6 mg/dL (ref 0.0–1.2)
CO2: 21 mmol/L (ref 20–29)
Calcium: 9.4 mg/dL (ref 8.7–10.2)
Chloride: 104 mmol/L (ref 96–106)
Creatinine, Ser: 0.94 mg/dL (ref 0.76–1.27)
Globulin, Total: 2.5 g/dL (ref 1.5–4.5)
Glucose: 121 mg/dL — ABNORMAL HIGH (ref 70–99)
Potassium: 5 mmol/L (ref 3.5–5.2)
Sodium: 137 mmol/L (ref 134–144)
Total Protein: 6.6 g/dL (ref 6.0–8.5)
eGFR: 96 mL/min/{1.73_m2} (ref >59)

## 2022-10-31 LAB — HEMOGLOBIN A1C
Est. average glucose Bld gHb Est-mCnc: 126 mg/dL
Hgb A1c MFr Bld: 6 % — ABNORMAL HIGH (ref 4.8–5.6)

## 2022-10-31 LAB — CBC
Hematocrit: 52 % — ABNORMAL HIGH (ref 37.5–51.0)
Hemoglobin: 17.6 g/dL (ref 13.0–17.7)
MCH: 30.8 pg (ref 26.6–33.0)
MCHC: 33.8 g/dL (ref 31.5–35.7)
MCV: 91 fL (ref 79–97)
Platelets: 283 10*3/uL (ref 150–450)
RBC: 5.72 x10E6/uL (ref 4.14–5.80)
RDW: 12.6 % (ref 11.6–15.4)
WBC: 7.5 10*3/uL (ref 3.4–10.8)

## 2022-10-31 LAB — LIPID PANEL
Chol/HDL Ratio: 4.9 ratio (ref 0.0–5.0)
Cholesterol, Total: 173 mg/dL (ref 100–199)
HDL: 35 mg/dL — ABNORMAL LOW (ref >39)
LDL Chol Calc (NIH): 112 mg/dL — ABNORMAL HIGH (ref 0–99)
Triglycerides: 147 mg/dL (ref 0–149)
VLDL Cholesterol Cal: 26 mg/dL (ref 5–40)

## 2022-10-31 LAB — VITAMIN D 25 HYDROXY (VIT D DEFICIENCY, FRACTURES): Vit D, 25-Hydroxy: 28.6 ng/mL — ABNORMAL LOW (ref 30.0–100.0)

## 2022-10-31 LAB — TSH: TSH: 1.94 u[IU]/mL (ref 0.450–4.500)

## 2022-11-02 DIAGNOSIS — Z6836 Body mass index (BMI) 36.0-36.9, adult: Secondary | ICD-10-CM | POA: Diagnosis not present

## 2022-11-02 DIAGNOSIS — I10 Essential (primary) hypertension: Secondary | ICD-10-CM | POA: Diagnosis not present

## 2022-11-02 DIAGNOSIS — M1711 Unilateral primary osteoarthritis, right knee: Secondary | ICD-10-CM | POA: Diagnosis not present

## 2022-11-02 DIAGNOSIS — R69 Illness, unspecified: Secondary | ICD-10-CM | POA: Diagnosis not present

## 2022-11-02 DIAGNOSIS — Z0000 Encounter for general adult medical examination without abnormal findings: Secondary | ICD-10-CM | POA: Diagnosis not present

## 2022-11-02 DIAGNOSIS — E291 Testicular hypofunction: Secondary | ICD-10-CM | POA: Diagnosis not present

## 2022-11-02 DIAGNOSIS — Z01818 Encounter for other preprocedural examination: Secondary | ICD-10-CM | POA: Diagnosis not present

## 2022-11-05 ENCOUNTER — Telehealth: Payer: Self-pay

## 2022-11-05 DIAGNOSIS — R7989 Other specified abnormal findings of blood chemistry: Secondary | ICD-10-CM

## 2022-11-05 DIAGNOSIS — E782 Mixed hyperlipidemia: Secondary | ICD-10-CM

## 2022-11-05 NOTE — Addendum Note (Signed)
Addended by: Truddie Hidden on: 11/05/2022 12:19 PM   Modules accepted: Orders

## 2022-11-05 NOTE — Telephone Encounter (Signed)
-----   Message from Jenean Lindau, MD sent at 11/01/2022 12:39 PM EST ----- Give low chol diet and recheck in 2 mo. Do not want to increase statin cos lft is little up .. ccpcp ----- Message ----- From: Truddie Hidden, RN Sent: 10/31/2022   2:13 PM EST To: Jenean Lindau, MD  Pt states he has not missed any doses of his statin. ----- Message ----- From: Jenean Lindau, MD Sent: 10/31/2022  11:15 AM EST To: Truddie Hidden, RN  Cc pcp. Is he taking statin Jenean Lindau, MD 10/31/2022 11:15 AM

## 2022-11-12 DIAGNOSIS — M25561 Pain in right knee: Secondary | ICD-10-CM | POA: Diagnosis not present

## 2022-11-12 DIAGNOSIS — M1711 Unilateral primary osteoarthritis, right knee: Secondary | ICD-10-CM | POA: Diagnosis not present

## 2022-11-12 DIAGNOSIS — Z01812 Encounter for preprocedural laboratory examination: Secondary | ICD-10-CM | POA: Diagnosis not present

## 2022-11-28 DIAGNOSIS — G8918 Other acute postprocedural pain: Secondary | ICD-10-CM | POA: Diagnosis not present

## 2022-11-28 DIAGNOSIS — Z96651 Presence of right artificial knee joint: Secondary | ICD-10-CM | POA: Diagnosis not present

## 2022-11-28 DIAGNOSIS — M1711 Unilateral primary osteoarthritis, right knee: Secondary | ICD-10-CM | POA: Diagnosis not present

## 2022-11-29 DIAGNOSIS — Z96651 Presence of right artificial knee joint: Secondary | ICD-10-CM | POA: Diagnosis not present

## 2022-11-29 DIAGNOSIS — M1711 Unilateral primary osteoarthritis, right knee: Secondary | ICD-10-CM | POA: Diagnosis not present

## 2023-02-24 DIAGNOSIS — R0609 Other forms of dyspnea: Secondary | ICD-10-CM | POA: Diagnosis not present

## 2023-02-24 DIAGNOSIS — R0602 Shortness of breath: Secondary | ICD-10-CM | POA: Diagnosis not present

## 2023-02-24 DIAGNOSIS — R062 Wheezing: Secondary | ICD-10-CM | POA: Diagnosis not present

## 2023-02-28 DIAGNOSIS — G4733 Obstructive sleep apnea (adult) (pediatric): Secondary | ICD-10-CM | POA: Diagnosis not present

## 2023-02-28 DIAGNOSIS — R0600 Dyspnea, unspecified: Secondary | ICD-10-CM | POA: Diagnosis not present

## 2023-02-28 DIAGNOSIS — J4530 Mild persistent asthma, uncomplicated: Secondary | ICD-10-CM | POA: Diagnosis not present

## 2023-03-03 DIAGNOSIS — E662 Morbid (severe) obesity with alveolar hypoventilation: Secondary | ICD-10-CM | POA: Diagnosis not present

## 2023-04-07 DIAGNOSIS — R0600 Dyspnea, unspecified: Secondary | ICD-10-CM | POA: Diagnosis not present

## 2023-04-07 DIAGNOSIS — J4530 Mild persistent asthma, uncomplicated: Secondary | ICD-10-CM | POA: Diagnosis not present

## 2023-04-11 DIAGNOSIS — E291 Testicular hypofunction: Secondary | ICD-10-CM | POA: Diagnosis not present

## 2023-04-18 DIAGNOSIS — E291 Testicular hypofunction: Secondary | ICD-10-CM | POA: Diagnosis not present

## 2023-04-18 DIAGNOSIS — N5201 Erectile dysfunction due to arterial insufficiency: Secondary | ICD-10-CM | POA: Diagnosis not present

## 2023-04-23 DIAGNOSIS — G4733 Obstructive sleep apnea (adult) (pediatric): Secondary | ICD-10-CM | POA: Diagnosis not present

## 2023-04-23 DIAGNOSIS — J4530 Mild persistent asthma, uncomplicated: Secondary | ICD-10-CM | POA: Diagnosis not present

## 2023-04-23 DIAGNOSIS — R0600 Dyspnea, unspecified: Secondary | ICD-10-CM | POA: Diagnosis not present

## 2023-04-24 ENCOUNTER — Telehealth: Payer: Self-pay

## 2023-04-24 NOTE — Telephone Encounter (Signed)
Dr. Tomie China would like to change his Lisinopril 20 mg daily to Valsartan. What would the equivalent    dose be?

## 2023-04-24 NOTE — Telephone Encounter (Signed)
Pt states that Dr. Blenda Nicely has recommended that the pt discuss changing his Lisinopril to another meditation due to now having a cough. Please advise

## 2023-04-25 MED ORDER — VALSARTAN 160 MG PO TABS: 160.0000 mg | ORAL_TABLET | Freq: Every day | ORAL | 3 refills | Status: DC

## 2023-04-25 NOTE — Telephone Encounter (Signed)
Start with valsartan 160 mg.

## 2023-04-25 NOTE — Telephone Encounter (Signed)
Recommendations reviewed with pt as per Dr. Tomie China note.  Pt verbalized understanding and had no additional questions.

## 2023-04-25 NOTE — Addendum Note (Signed)
Addended by: Eleonore Chiquito on: 04/25/2023 07:52 AM   Modules accepted: Orders

## 2023-04-29 DIAGNOSIS — J984 Other disorders of lung: Secondary | ICD-10-CM | POA: Diagnosis not present

## 2023-04-29 DIAGNOSIS — R0600 Dyspnea, unspecified: Secondary | ICD-10-CM | POA: Diagnosis not present

## 2023-04-29 DIAGNOSIS — I700 Atherosclerosis of aorta: Secondary | ICD-10-CM | POA: Diagnosis not present

## 2023-05-05 DIAGNOSIS — G4733 Obstructive sleep apnea (adult) (pediatric): Secondary | ICD-10-CM | POA: Diagnosis not present

## 2023-05-06 DIAGNOSIS — G4733 Obstructive sleep apnea (adult) (pediatric): Secondary | ICD-10-CM | POA: Diagnosis not present

## 2023-05-06 DIAGNOSIS — R0600 Dyspnea, unspecified: Secondary | ICD-10-CM | POA: Diagnosis not present

## 2023-05-06 DIAGNOSIS — J4530 Mild persistent asthma, uncomplicated: Secondary | ICD-10-CM | POA: Diagnosis not present

## 2023-05-29 DIAGNOSIS — G4733 Obstructive sleep apnea (adult) (pediatric): Secondary | ICD-10-CM | POA: Diagnosis not present

## 2023-06-04 DIAGNOSIS — E662 Morbid (severe) obesity with alveolar hypoventilation: Secondary | ICD-10-CM | POA: Diagnosis not present

## 2023-06-04 DIAGNOSIS — G4733 Obstructive sleep apnea (adult) (pediatric): Secondary | ICD-10-CM | POA: Diagnosis not present

## 2023-06-04 DIAGNOSIS — R0600 Dyspnea, unspecified: Secondary | ICD-10-CM | POA: Diagnosis not present

## 2023-06-04 DIAGNOSIS — J4530 Mild persistent asthma, uncomplicated: Secondary | ICD-10-CM | POA: Diagnosis not present

## 2023-06-12 DIAGNOSIS — G4733 Obstructive sleep apnea (adult) (pediatric): Secondary | ICD-10-CM | POA: Diagnosis not present

## 2023-06-12 DIAGNOSIS — Z7982 Long term (current) use of aspirin: Secondary | ICD-10-CM | POA: Diagnosis not present

## 2023-06-12 DIAGNOSIS — R079 Chest pain, unspecified: Secondary | ICD-10-CM | POA: Diagnosis not present

## 2023-06-12 DIAGNOSIS — R9431 Abnormal electrocardiogram [ECG] [EKG]: Secondary | ICD-10-CM | POA: Diagnosis not present

## 2023-06-12 DIAGNOSIS — Z888 Allergy status to other drugs, medicaments and biological substances status: Secondary | ICD-10-CM | POA: Diagnosis not present

## 2023-06-12 DIAGNOSIS — Z79899 Other long term (current) drug therapy: Secondary | ICD-10-CM | POA: Diagnosis not present

## 2023-06-12 DIAGNOSIS — Z87891 Personal history of nicotine dependence: Secondary | ICD-10-CM | POA: Diagnosis not present

## 2023-06-12 DIAGNOSIS — E7800 Pure hypercholesterolemia, unspecified: Secondary | ICD-10-CM | POA: Diagnosis not present

## 2023-06-12 DIAGNOSIS — J45909 Unspecified asthma, uncomplicated: Secondary | ICD-10-CM | POA: Diagnosis not present

## 2023-06-12 DIAGNOSIS — M1990 Unspecified osteoarthritis, unspecified site: Secondary | ICD-10-CM | POA: Diagnosis not present

## 2023-06-12 DIAGNOSIS — R61 Generalized hyperhidrosis: Secondary | ICD-10-CM | POA: Diagnosis not present

## 2023-06-12 DIAGNOSIS — F32A Depression, unspecified: Secondary | ICD-10-CM | POA: Diagnosis not present

## 2023-06-12 DIAGNOSIS — I517 Cardiomegaly: Secondary | ICD-10-CM | POA: Diagnosis not present

## 2023-06-12 DIAGNOSIS — I10 Essential (primary) hypertension: Secondary | ICD-10-CM | POA: Diagnosis not present

## 2023-06-13 DIAGNOSIS — R079 Chest pain, unspecified: Secondary | ICD-10-CM | POA: Diagnosis not present

## 2023-06-18 DIAGNOSIS — G4733 Obstructive sleep apnea (adult) (pediatric): Secondary | ICD-10-CM | POA: Diagnosis not present

## 2023-06-20 ENCOUNTER — Other Ambulatory Visit: Payer: Self-pay

## 2023-06-23 NOTE — Progress Notes (Unsigned)
Cardiology Office Note:  .   Date:  06/24/2023  ID:  Charlann Lange, DOB 1965-09-26, MRN [id number] PCP: Paulina Fusi, MD  [facility] Providers Cardiologist:  Garwin Brothers, MD    History of Present Illness: .   Burtis Imhoff is a 57 y.o. male with a past medical history of hypertension, early family history of CAD, elevated coronary artery calcium score, OSA, dyslipidemia.  02/15/2020 calcium score calcium score of 89.2, 84th percentile, mild dilatation of ascending aorta at 3.8 cm 12/21/2019 echo EF 60 to 65%, moderate concentric LVH, LV with speckled pattern suggesting infiltrative disease, grade 1 DD 12/02/2019 Lexiscan normal, low risk study  Most recently evaluated by Dr. Tomie China on 10/30/2022, he had upcoming right knee replacement, was doing well from a cardiac perspective, advised he could follow-up in 9 months.  Admitted to [facility] on 06/12/2023 with chest pain, the chest pain apparently woke him up from his sleep, was associated with some shortness of breath  but was not reproducible.  EKG was normal, troponin 0.02, 0.01, 0.01, he was given nitroglycerin x 3 with relief of pain.  He underwent a stress evaluation which revealed no reversible ischemia, but inconclusive.  Labs were normal on day of discharge.  He presents today for follow-up after recent hospital visit as outlined above.  He stays relatively active at work, works as PT with home health.  He has a early family history of CAD, also his ischemic evaluation revealed no reversible ischemia but was inconclusive.  This has him understandably concerned. He denies chest pain, palpitations, dyspnea, pnd, orthopnea, n, v, dizziness, syncope, edema, weight gain, or early satiety.   ROS: Review of Systems  All other systems reviewed and are negative.    Studies Reviewed: .        Cardiac Studies & Procedures     STRESS TESTS  MYOCARDIAL PERFUSION IMAGING 12/02/2019  Narrative  The left ventricular ejection  fraction is hyperdynamic (>65%).  Nuclear stress EF: 69%.  There was no ST segment deviation noted during stress.  The study is normal.  This is a low risk study.   ECHOCARDIOGRAM  ECHOCARDIOGRAM COMPLETE 12/21/2019  Narrative ECHOCARDIOGRAM REPORT    Patient Name:   DEZ STAUFFER Date of Exam: 12/21/2019 Medical Rec #:  [id number]  Height:       73.0 in Accession #:    [id number] Weight:       257.0 lb Date of Birth:  1966/07/26   BSA:          2.394 m Patient Age:    53 years   BP:           128/70 mmHg Patient Gender: M          HR:           71 bpm. Exam Location:  [place]  Procedure: 2D Echo  Indications:    Chest Pain 786.50 / R07.9  History:        Patient has no prior history of Echocardiogram examinations. Arrythmias:Abnormal EKG; Risk Factors:Dyslipidemia.  Sonographer:    Louie Boston Referring Phys: Rito Ehrlich [facility]  IMPRESSIONS   1. Left ventricular ejection fraction, by estimation, is 60 to 65%. The left ventricle has normal function. The left ventricle has no regional wall motion abnormalities. There is moderate concentric left ventricular hypertrophy. The Left ventricle with speckled pattern suggesting infiltrative disease. Left Ventricular longitudinal strain not performed, suggest performing LV strain in subsequent TTE. 2. Left ventricular diastolic  parameters are consistent with Grade I diastolic dysfunction (impaired relaxation). 3. Right ventricular systolic function is normal. The right ventricular size is normal. There is normal pulmonary artery systolic pressure. 4. The mitral valve is normal in structure. No evidence of mitral valve regurgitation. No evidence of mitral stenosis. 5. The aortic valve is normal in structure. Aortic valve regurgitation is not visualized. No aortic stenosis is present. 6. The inferior vena cava is normal in size with greater than 50% respiratory variability, suggesting right atrial pressure of 3 mmHg.  Comparison(s):  No prior Echocardiogram.  FINDINGS Left Ventricle: Left ventricular ejection fraction, by estimation, is 60 to 65%. The left ventricle has normal function. The left ventricle has no regional wall motion abnormalities. The left ventricular internal cavity size was normal in size. There is moderate concentric left ventricular hypertrophy. Left ventricular diastolic parameters are consistent with Grade I diastolic dysfunction (impaired relaxation).  Right Ventricle: The right ventricular size is normal. No increase in right ventricular wall thickness. Right ventricular systolic function is normal. There is normal pulmonary artery systolic pressure. The tricuspid regurgitant velocity is 2.24 m/s, and with an assumed right atrial pressure of 3 mmHg, the estimated right ventricular systolic pressure is 23.1 mmHg.  Left Atrium: Left atrial size was normal in size.  Right Atrium: Right atrial size was normal in size.  Pericardium: There is no evidence of pericardial effusion.  Mitral Valve: The mitral valve is normal in structure. Normal mobility of the mitral valve leaflets. No evidence of mitral valve regurgitation. No evidence of mitral valve stenosis.  Tricuspid Valve: The tricuspid valve is normal in structure. Tricuspid valve regurgitation is not demonstrated. No evidence of tricuspid stenosis.  Aortic Valve: The aortic valve is normal in structure. Aortic valve regurgitation is not visualized. No aortic stenosis is present.  Pulmonic Valve: The pulmonic valve was not well visualized. Pulmonic valve regurgitation is not visualized. No evidence of pulmonic stenosis.  Aorta: The aortic root is normal in size and structure.  Venous: The inferior vena cava is normal in size with greater than 50% respiratory variability, suggesting right atrial pressure of 3 mmHg.  IAS/Shunts: No atrial level shunt detected by color flow Doppler.   LEFT VENTRICLE PLAX 2D LVIDd:         4.60 cm   Diastology LVIDs:         2.30 cm  LV e' lateral:   6.85 cm/s LV PW:         1.60 cm  LV E/e' lateral: 8.7 LV IVS:        1.50 cm  LV e' medial:    5.77 cm/s LVOT diam:     2.40 cm  LV E/e' medial:  10.3 LV SV:         98 LV SV Index:   41 LVOT Area:     4.52 cm   RIGHT VENTRICLE             IVC RV S prime:     13.80 cm/s  IVC diam: 1.50 cm TAPSE (M-mode): 2.9 cm  LEFT ATRIUM             Index       RIGHT ATRIUM           Index LA diam:        3.80 cm 1.59 cm/m  RA Area:     10.70 cm LA Vol (A2C):   44.8 ml 18.72 ml/m RA Volume:   22.60 ml  9.44  ml/m LA Vol (A4C):   22.0 ml 9.19 ml/m LA Biplane Vol: 33.2 ml 13.87 ml/m AORTIC VALVE LVOT Vmax:   109.00 cm/s LVOT Vmean:  71.400 cm/s LVOT VTI:    0.216 m  AORTA Ao Root diam: 3.40 cm Ao Asc diam:  3.60 cm  MITRAL VALVE               TRICUSPID VALVE MV Area (PHT): 2.66 cm    TR Peak grad:   20.1 mmHg MV Decel Time: 285 msec    TR Vmax:        224.00 cm/s MV E velocity: 59.70 cm/s MV A velocity: 72.80 cm/s  SHUNTS MV E/A ratio:  0.82        Systemic VTI:  0.22 m Systemic Diam: 2.40 cm  Kardie Tobb DO Electronically signed by Thomasene Ripple DO Signature Date/Time: 12/21/2019/1:26:12 PM    Final     CT SCANS  CT CARDIAC SCORING (SELF PAY ONLY) 02/15/2020  Addendum 02/15/2020  9:33 PM ADDENDUM REPORT: 02/15/2020 21:30  CLINICAL DATA:  70M with hypertension and hyperlipidemia for risk stratification  EXAM: Coronary Calcium Score  TECHNIQUE: The patient was scanned on a [employer] scanner. Axial non-contrast 3 mm slices were carried out through the heart. The data set was analyzed on a dedicated work station and scored using the Agatson method.  FINDINGS: Non-cardiac: See separate report from [facility].  Ascending Aorta: Mildly dilated.  3.8 cm.  No calcification.  Pericardium: Normal  Coronary arteries: Normal anatomy. Right dominant. Mild calcification in the LCX  territory.  IMPRESSION: Coronary calcium score of 89.2. This was 84th percentile for age and sex matched control.  Chilton Si, MD   Electronically Signed By: Chilton Si On: 02/15/2020 21:30  Narrative EXAM: OVER-READ INTERPRETATION  CT CHEST  The following report is an over-read performed by radiologist Dr. Charlett Nose of [facility] on 02/15/2020. This over-read does not include interpretation of cardiac or coronary anatomy or pathology. The coronary calcium score interpretation by the cardiologist is attached.  COMPARISON:  None.  FINDINGS: Vascular: Heart is normal size.  Visualized aorta normal caliber.  Mediastinum/Nodes: No adenopathy  Lungs/Pleura: Linear atelectasis or scarring at the left base. Visualized lungs otherwise clear. No effusions.  Upper Abdomen: Imaging into the upper abdomen shows no acute findings.  Musculoskeletal: Chest wall soft tissues are unremarkable. No acute bony abnormality.  IMPRESSION: No acute or significant extracardiac abnormality.  Electronically Signed: By: Charlett Nose M.D. On: 02/15/2020 08:59          Risk Assessment/Calculations:             Physical Exam:   VS:  BP 136/78   Pulse 84   Ht 6\' 2"  (1.88 m)   Wt 291 lb 6.4 oz (132.2 kg)   SpO2 95%   BMI 37.41 kg/m    Wt Readings from Last 3 Encounters:  06/24/23 291 lb 6.4 oz (132.2 kg)  10/30/22 277 lb 3.2 oz (125.7 kg)  01/22/21 258 lb 3.2 oz (117.1 kg)    GEN: Well nourished, well developed in no acute distress NECK: No JVD; No carotid bruits CARDIAC: RRR, no murmurs, rubs, gallops RESPIRATORY:  Clear to auscultation without rales, wheezing or rhonchi  ABDOMEN: Soft, non-tender, non-distended EXTREMITIES:  No edema; No deformity   ASSESSMENT AND PLAN: .   Elevated coronary artery calcium score/precordial pain-calcium score calcium score of 89.2, placing him in the 84th percentile.  He also has had some  issues with precordial pain,  workup at the hospital was unrevealing however he has an early family history of CAD.  I think we need to investigate further with a coronary CTA, he is agreeable to this.  Hypertension-blood pressure is controlled at 136/78, continue valsartan 160 mg daily  Early family history of CAD-his father underwent CABG when he was 33, apparently passed at 40 from heart disease.  Will check lipoprotein a.  Dyslipidemia-most recent LDL was 112, will repeat FLP and LFTs today, will also check lipoprotein a.  Depending on the results of his coronary CTA, we will likely make some changes as his LDL should be less than 100 regardless.       Dispo: Coronary CTA, CMET, FLP, lipoprotein a.  Follow-up will be in 6 months with Dr. Tomie China.  Signed, Flossie Dibble, NP

## 2023-06-24 ENCOUNTER — Encounter: Payer: Self-pay | Admitting: Cardiology

## 2023-06-24 ENCOUNTER — Ambulatory Visit: Payer: 59 | Attending: Cardiology | Admitting: Cardiology

## 2023-06-24 VITALS — BP 136/78 | HR 84 | Ht 74.0 in | Wt 291.4 lb

## 2023-06-24 DIAGNOSIS — Z01812 Encounter for preprocedural laboratory examination: Secondary | ICD-10-CM | POA: Diagnosis not present

## 2023-06-24 DIAGNOSIS — E782 Mixed hyperlipidemia: Secondary | ICD-10-CM | POA: Diagnosis not present

## 2023-06-24 DIAGNOSIS — R072 Precordial pain: Secondary | ICD-10-CM

## 2023-06-24 DIAGNOSIS — G4730 Sleep apnea, unspecified: Secondary | ICD-10-CM

## 2023-06-24 DIAGNOSIS — I10 Essential (primary) hypertension: Secondary | ICD-10-CM | POA: Diagnosis not present

## 2023-06-24 DIAGNOSIS — R931 Abnormal findings on diagnostic imaging of heart and coronary circulation: Secondary | ICD-10-CM

## 2023-06-24 DIAGNOSIS — Z8249 Family history of ischemic heart disease and other diseases of the circulatory system: Secondary | ICD-10-CM

## 2023-06-24 MED ORDER — METOPROLOL TARTRATE 100 MG PO TABS: 100.0000 mg | ORAL_TABLET | Freq: Once | ORAL | 0 refills | Status: DC

## 2023-06-24 NOTE — Patient Instructions (Addendum)
Medication Instructions:  Your physician recommends that you continue on your current medications as directed. Please refer to the Current Medication list given to you today.  *If you need a refill on your cardiac medications before your next appointment, please call your pharmacy*   Lab Work: Your physician recommends that you return for lab work in: Today for CMP, LPa and Fasting Lipid Panel  If you have labs (blood work) drawn today and your tests are completely normal, you will receive your results only by: MyChart Message (if you have MyChart) OR A paper copy in the mail If you have any lab test that is abnormal or we need to change your treatment, we will call you to review the results.   Testing/Procedures: Please arrive at the [facility] main entrance of [facility] at xx:xx AM (30-45 minutes prior to test start time)  [facility] [address] [place], [place] [address] [phone]  Proceed to the [facility] Radiology Department (First Floor).  Please follow these instructions carefully (unless otherwise directed):  Hold all erectile dysfunction medications at least 48 hours prior to test.  On the Night Before the Test: Drink plenty of water. Do not consume any caffeinated/decaffeinated beverages or chocolate 12 hours prior to your test. Do not take any antihistamines 12 hours prior to your test. On the Day of the Test: Drink plenty of water. Do not drink any water within one hour of the test. Do not eat any food 4 hours prior to the test. You may take your regular medications prior to the test. IF NOT ON A BETA BLOCKER - Take 100 mg of lopressor (metoprolol) two hours before the test. After the Test: Drink plenty of water. After receiving IV contrast, you may experience a mild flushed feeling. This is normal. On occasion, you may experience a mild rash up to 24 hours after the test. This is not dangerous. If this occurs, you can take  Benadryl 25 mg and increase your fluid intake. If you experience trouble breathing, this can be serious. If it is severe call 911 IMMEDIATELY. If it is mild, please call our office.   Follow-Up: At [facility], you and your health needs are our priority.  As part of our continuing mission to provide you with exceptional heart care, we have created designated Provider Care Teams.  These Care Teams include your primary Cardiologist (physician) and Advanced Practice Providers (APPs -  Physician Assistants and Nurse Practitioners) who all work together to provide you with the care you need, when you need it.  We recommend signing up for the patient portal called "MyChart".  Sign up information is provided on this After Visit Summary.  MyChart is used to connect with patients for Virtual Visits (Telemedicine).  Patients are able to view lab/test results, encounter notes, upcoming appointments, etc.  Non-urgent messages can be sent to your provider as well.   To learn more about what you can do with MyChart, go to [email or web address].    Your next appointment:   6 month(s)  Provider:   Belva Crome, MD    Other Instructions

## 2023-06-26 ENCOUNTER — Other Ambulatory Visit: Payer: Self-pay

## 2023-06-26 DIAGNOSIS — E782 Mixed hyperlipidemia: Secondary | ICD-10-CM

## 2023-06-26 LAB — LIPID PANEL
Chol/HDL Ratio: 4.6 ratio (ref 0.0–5.0)
Cholesterol, Total: 132 mg/dL (ref 100–199)
HDL: 29 mg/dL — ABNORMAL LOW
LDL Chol Calc (NIH): 84 mg/dL (ref 0–99)
Triglycerides: 99 mg/dL (ref 0–149)
VLDL Cholesterol Cal: 19 mg/dL (ref 5–40)

## 2023-06-26 LAB — COMPREHENSIVE METABOLIC PANEL WITH GFR
ALT: 39 IU/L (ref 0–44)
AST: 33 IU/L (ref 0–40)
Albumin: 4.2 g/dL (ref 3.8–4.9)
Alkaline Phosphatase: 89 IU/L (ref 44–121)
BUN/Creatinine Ratio: 17 (ref 9–20)
BUN: 17 mg/dL (ref 6–24)
Bilirubin Total: 0.6 mg/dL (ref 0.0–1.2)
CO2: 24 mmol/L (ref 20–29)
Calcium: 9.3 mg/dL (ref 8.7–10.2)
Chloride: 101 mmol/L (ref 96–106)
Creatinine, Ser: 0.98 mg/dL (ref 0.76–1.27)
Globulin, Total: 2.5 g/dL (ref 1.5–4.5)
Glucose: 101 mg/dL — ABNORMAL HIGH (ref 70–99)
Potassium: 4.9 mmol/L (ref 3.5–5.2)
Sodium: 137 mmol/L (ref 134–144)
Total Protein: 6.7 g/dL (ref 6.0–8.5)
eGFR: 91 mL/min/1.73

## 2023-06-26 LAB — LIPOPROTEIN A (LPA): Lipoprotein (a): <8.4 nmol/L

## 2023-06-26 MED ORDER — ROSUVASTATIN CALCIUM 40 MG PO TABS: 40.0000 mg | ORAL_TABLET | Freq: Every day | ORAL | 3 refills | Status: DC

## 2023-07-07 ENCOUNTER — Telehealth (HOSPITAL_COMMUNITY): Payer: Self-pay | Admitting: *Deleted

## 2023-07-07 NOTE — Telephone Encounter (Signed)
Patient returning call about his upcoming cardiac imaging study; pt verbalizes understanding of appt date/time, parking situation and where to check in, pre-test NPO status and medications ordered, and verified current allergies; name and call back number provided for further questions should they arise  Larey Brick RN Navigator Cardiac Imaging [facility] [phone] office [phone] cell  Patient to take 100mg  metoprolol tartrate two hours prior to his cardiac CT scan. He is aware to arrive at 7:30 AM.

## 2023-07-07 NOTE — Telephone Encounter (Signed)
Attempted to call patient regarding upcoming cardiac CT appointment. °Left message on voicemail with name and callback number ° °[name] RN Navigator Cardiac Imaging °[facility] °[phone] Office °[phone] Cell ° °

## 2023-07-09 ENCOUNTER — Ambulatory Visit (HOSPITAL_COMMUNITY)
Admission: RE | Admit: 2023-07-09 | Discharge: 2023-07-09 | Disposition: A | Discharge status: Home / Self Care | Payer: 59 | Source: Ambulatory Visit | Attending: Internal Medicine | Admitting: Internal Medicine

## 2023-07-09 ENCOUNTER — Ambulatory Visit (HOSPITAL_COMMUNITY)
Admission: RE | Admit: 2023-07-09 | Discharge: 2023-07-09 | Disposition: A | Discharge status: Home / Self Care | Payer: 59 | Source: Ambulatory Visit | Attending: Cardiology | Admitting: Cardiology

## 2023-07-09 ENCOUNTER — Other Ambulatory Visit: Payer: Self-pay | Admitting: Internal Medicine

## 2023-07-09 DIAGNOSIS — R931 Abnormal findings on diagnostic imaging of heart and coronary circulation: Secondary | ICD-10-CM | POA: Insufficient documentation

## 2023-07-09 DIAGNOSIS — I2510 Atherosclerotic heart disease of native coronary artery without angina pectoris: Secondary | ICD-10-CM

## 2023-07-09 DIAGNOSIS — R072 Precordial pain: Secondary | ICD-10-CM | POA: Insufficient documentation

## 2023-07-09 MED ORDER — IOHEXOL 350 MG/ML SOLN
100.0000 mL | Freq: Once | INTRAVENOUS | Status: AC | PRN
  Administered 2023-07-09: 100 mL via INTRAVENOUS

## 2023-07-09 MED ORDER — NITROGLYCERIN 0.4 MG SL SUBL
0.8000 mg | SUBLINGUAL_TABLET | Freq: Once | SUBLINGUAL | Status: AC
  Administered 2023-07-09: 0.8 mg via SUBLINGUAL

## 2023-07-09 MED ORDER — NITROGLYCERIN 0.4 MG SL SUBL
SUBLINGUAL_TABLET | SUBLINGUAL | Status: AC
Start: 2023-07-09 — End: ?
  Filled 2023-07-09: qty 2

## 2023-07-11 ENCOUNTER — Telehealth: Payer: Self-pay | Admitting: Cardiology

## 2023-07-11 ENCOUNTER — Telehealth: Payer: Self-pay

## 2023-07-11 NOTE — Telephone Encounter (Signed)
Pt calling to get CT results, requesting cb

## 2023-07-11 NOTE — Telephone Encounter (Signed)
Results reviewed with pt as per [facility] note.  Pt verbalized understanding and had no additional questions. Routed to PCP.

## 2023-07-19 DIAGNOSIS — G4733 Obstructive sleep apnea (adult) (pediatric): Secondary | ICD-10-CM | POA: Diagnosis not present

## 2023-07-21 DIAGNOSIS — J4530 Mild persistent asthma, uncomplicated: Secondary | ICD-10-CM | POA: Diagnosis not present

## 2023-07-22 DIAGNOSIS — G4733 Obstructive sleep apnea (adult) (pediatric): Secondary | ICD-10-CM | POA: Diagnosis not present

## 2023-07-29 DIAGNOSIS — E291 Testicular hypofunction: Secondary | ICD-10-CM | POA: Diagnosis not present

## 2023-07-29 DIAGNOSIS — Z125 Encounter for screening for malignant neoplasm of prostate: Secondary | ICD-10-CM | POA: Diagnosis not present

## 2023-08-01 DIAGNOSIS — G4733 Obstructive sleep apnea (adult) (pediatric): Secondary | ICD-10-CM | POA: Diagnosis not present

## 2023-08-01 DIAGNOSIS — R0600 Dyspnea, unspecified: Secondary | ICD-10-CM | POA: Diagnosis not present

## 2023-08-01 DIAGNOSIS — J4530 Mild persistent asthma, uncomplicated: Secondary | ICD-10-CM | POA: Diagnosis not present

## 2023-08-04 ENCOUNTER — Telehealth: Payer: Self-pay

## 2023-08-04 NOTE — Telephone Encounter (Signed)
Per DPR detailed message left on VM with lab results as reviewed by Jimmy Footman.

## 2023-08-04 NOTE — Telephone Encounter (Signed)
-----   Message from Flossie Dibble sent at 08/02/2023 10:32 AM EST ----- Over read by the radiologist showed nothing abnormal. Good result.

## 2023-08-05 DIAGNOSIS — E291 Testicular hypofunction: Secondary | ICD-10-CM | POA: Diagnosis not present

## 2023-08-18 DIAGNOSIS — G4733 Obstructive sleep apnea (adult) (pediatric): Secondary | ICD-10-CM | POA: Diagnosis not present

## 2023-08-22 DIAGNOSIS — G4733 Obstructive sleep apnea (adult) (pediatric): Secondary | ICD-10-CM | POA: Diagnosis not present

## 2023-09-09 DIAGNOSIS — Z6839 Body mass index (BMI) 39.0-39.9, adult: Secondary | ICD-10-CM | POA: Diagnosis not present

## 2023-09-09 DIAGNOSIS — I10 Essential (primary) hypertension: Secondary | ICD-10-CM | POA: Diagnosis not present

## 2023-09-09 DIAGNOSIS — F331 Major depressive disorder, recurrent, moderate: Secondary | ICD-10-CM | POA: Diagnosis not present

## 2023-09-09 DIAGNOSIS — Z79899 Other long term (current) drug therapy: Secondary | ICD-10-CM | POA: Diagnosis not present

## 2023-09-09 DIAGNOSIS — E785 Hyperlipidemia, unspecified: Secondary | ICD-10-CM | POA: Diagnosis not present

## 2023-09-09 DIAGNOSIS — M5412 Radiculopathy, cervical region: Secondary | ICD-10-CM | POA: Diagnosis not present

## 2023-09-18 DIAGNOSIS — G4733 Obstructive sleep apnea (adult) (pediatric): Secondary | ICD-10-CM | POA: Diagnosis not present

## 2023-09-22 DIAGNOSIS — G4733 Obstructive sleep apnea (adult) (pediatric): Secondary | ICD-10-CM | POA: Diagnosis not present

## 2023-10-19 DIAGNOSIS — G4733 Obstructive sleep apnea (adult) (pediatric): Secondary | ICD-10-CM | POA: Diagnosis not present

## 2023-10-22 DIAGNOSIS — G4733 Obstructive sleep apnea (adult) (pediatric): Secondary | ICD-10-CM | POA: Diagnosis not present

## 2023-11-16 DIAGNOSIS — G4733 Obstructive sleep apnea (adult) (pediatric): Secondary | ICD-10-CM | POA: Diagnosis not present

## 2023-11-21 DIAGNOSIS — G4733 Obstructive sleep apnea (adult) (pediatric): Secondary | ICD-10-CM | POA: Diagnosis not present

## 2023-12-08 DIAGNOSIS — F410 Panic disorder [episodic paroxysmal anxiety] without agoraphobia: Secondary | ICD-10-CM | POA: Diagnosis not present

## 2023-12-17 DIAGNOSIS — G4733 Obstructive sleep apnea (adult) (pediatric): Secondary | ICD-10-CM | POA: Diagnosis not present

## 2023-12-22 DIAGNOSIS — G4733 Obstructive sleep apnea (adult) (pediatric): Secondary | ICD-10-CM | POA: Diagnosis not present

## 2024-01-16 DIAGNOSIS — G4733 Obstructive sleep apnea (adult) (pediatric): Secondary | ICD-10-CM | POA: Diagnosis not present

## 2024-02-11 ENCOUNTER — Other Ambulatory Visit: Payer: Self-pay | Admitting: Cardiology

## 2024-02-16 DIAGNOSIS — G4733 Obstructive sleep apnea (adult) (pediatric): Secondary | ICD-10-CM | POA: Diagnosis not present

## 2024-03-17 DIAGNOSIS — G4733 Obstructive sleep apnea (adult) (pediatric): Secondary | ICD-10-CM | POA: Diagnosis not present

## 2024-03-24 DIAGNOSIS — G4733 Obstructive sleep apnea (adult) (pediatric): Secondary | ICD-10-CM | POA: Diagnosis not present

## 2024-03-26 DIAGNOSIS — I10 Essential (primary) hypertension: Secondary | ICD-10-CM | POA: Diagnosis not present

## 2024-03-26 DIAGNOSIS — E785 Hyperlipidemia, unspecified: Secondary | ICD-10-CM | POA: Diagnosis not present

## 2024-03-30 DIAGNOSIS — F331 Major depressive disorder, recurrent, moderate: Secondary | ICD-10-CM | POA: Diagnosis not present

## 2024-03-30 DIAGNOSIS — I10 Essential (primary) hypertension: Secondary | ICD-10-CM | POA: Diagnosis not present

## 2024-03-30 DIAGNOSIS — M5412 Radiculopathy, cervical region: Secondary | ICD-10-CM | POA: Diagnosis not present

## 2024-03-30 DIAGNOSIS — E785 Hyperlipidemia, unspecified: Secondary | ICD-10-CM | POA: Diagnosis not present

## 2024-03-30 DIAGNOSIS — F909 Attention-deficit hyperactivity disorder, unspecified type: Secondary | ICD-10-CM | POA: Diagnosis not present

## 2024-06-23 ENCOUNTER — Other Ambulatory Visit: Payer: Self-pay | Admitting: Cardiology

## 2024-06-23 DIAGNOSIS — G4733 Obstructive sleep apnea (adult) (pediatric): Secondary | ICD-10-CM | POA: Diagnosis not present

## 2024-07-23 DIAGNOSIS — N5201 Erectile dysfunction due to arterial insufficiency: Secondary | ICD-10-CM | POA: Diagnosis not present

## 2024-07-23 DIAGNOSIS — R399 Unspecified symptoms and signs involving the genitourinary system: Secondary | ICD-10-CM | POA: Diagnosis not present

## 2024-07-23 DIAGNOSIS — E291 Testicular hypofunction: Secondary | ICD-10-CM | POA: Diagnosis not present

## 2024-07-28 ENCOUNTER — Other Ambulatory Visit: Payer: Self-pay | Admitting: Cardiology

## 2024-08-09 ENCOUNTER — Other Ambulatory Visit: Payer: Self-pay | Admitting: Cardiology

## 2024-08-19 DIAGNOSIS — E785 Hyperlipidemia, unspecified: Secondary | ICD-10-CM | POA: Diagnosis not present

## 2024-08-19 DIAGNOSIS — J208 Acute bronchitis due to other specified organisms: Secondary | ICD-10-CM | POA: Diagnosis not present

## 2024-08-19 DIAGNOSIS — B9689 Other specified bacterial agents as the cause of diseases classified elsewhere: Secondary | ICD-10-CM | POA: Diagnosis not present

## 2024-08-19 DIAGNOSIS — I10 Essential (primary) hypertension: Secondary | ICD-10-CM | POA: Diagnosis not present

## 2024-08-19 DIAGNOSIS — J019 Acute sinusitis, unspecified: Secondary | ICD-10-CM | POA: Diagnosis not present

## 2024-08-19 DIAGNOSIS — F909 Attention-deficit hyperactivity disorder, unspecified type: Secondary | ICD-10-CM | POA: Diagnosis not present

## 2024-08-19 DIAGNOSIS — F410 Panic disorder [episodic paroxysmal anxiety] without agoraphobia: Secondary | ICD-10-CM | POA: Diagnosis not present

## 2024-09-06 ENCOUNTER — Other Ambulatory Visit: Payer: Self-pay

## 2024-09-07 ENCOUNTER — Encounter: Payer: Self-pay | Admitting: Cardiology

## 2024-09-07 ENCOUNTER — Ambulatory Visit: Attending: Cardiology | Admitting: Cardiology

## 2024-09-07 VITALS — BP 130/70 | HR 72 | Ht 74.0 in | Wt 258.4 lb

## 2024-09-07 DIAGNOSIS — E782 Mixed hyperlipidemia: Secondary | ICD-10-CM

## 2024-09-07 DIAGNOSIS — R931 Abnormal findings on diagnostic imaging of heart and coronary circulation: Secondary | ICD-10-CM

## 2024-09-07 DIAGNOSIS — I10 Essential (primary) hypertension: Secondary | ICD-10-CM | POA: Diagnosis not present

## 2024-09-07 DIAGNOSIS — E66811 Obesity, class 1: Secondary | ICD-10-CM | POA: Diagnosis not present

## 2024-09-07 DIAGNOSIS — G4730 Sleep apnea, unspecified: Secondary | ICD-10-CM | POA: Diagnosis not present

## 2024-09-07 NOTE — Progress Notes (Signed)
 " Cardiology Office Note:    Date:  09/07/2024   ID:  Terry Harper, DOB 12-May-1966, MRN [id number]  PCP:  Keren Vicenta BRAVO, MD  Cardiologist:  Jennifer JONELLE Crape, MD   Referring MD: Keren Vicenta BRAVO, MD    ASSESSMENT:    1. Mixed dyslipidemia   2. Essential hypertension   3. Elevated coronary artery calcium  score   4. Sleep apnea in adult   5. Obesity (BMI 30.0-34.9)    PLAN:    In order of problems listed above:  Elevated calcium  score: Secondary prevention stressed with the patient.  Importance of compliance with diet medication stressed and patient verbalized standing.  He was advised to walk at least half an hour a day on a daily basis and he promises to do so. Essential hypertension: Blood pressure stable and diet was emphasized.  Lifestyle modification urged. Mixed dyslipidemia: On lipid-lowering medications followed by primary care.  He is going to have blood work again done in January.  Last KPN sheet results were discussed with him.  Goal LDL less than 60. Sleep apnea: Sleep health issues were discussed. Obesity: Weight reduction stressed diet emphasized and he promises to do better.  Risks of obesity explained. Patient will be seen in follow-up appointment in 6 months or earlier if the patient has any concerns.    Medication Adjustments/Labs and Tests Ordered: Current medicines are reviewed at length with the patient today.  Concerns regarding medicines are outlined above.  Orders Placed This Encounter  Procedures   EKG 12-Lead   No orders of the defined types were placed in this encounter.    No chief complaint on file.    History of Present Illness:    Terry Harper is a 58 y.o. male.  Patient has past medical history of coronary artery score elevation, essential hypertension, mixed dyslipidemia, obesity and sleep apnea.  She denies any problems at this time and takes care of activities of daily living.  No chest pain orthopnea or PND.  At the time of my  evaluation, the patient is alert awake oriented and in no distress.  Past Medical History:  Diagnosis Date   Abnormal EKG 11/12/2019   Adult ADHD    Carpal tunnel syndrome, bilateral    Chest tightness 11/12/2019   Cigar smoker 07/22/2016   Controlled depression    Depression    DJD (degenerative joint disease) 07/22/2016   Elevated coronary artery calcium  score 07/31/2020   Elevated hemoglobin    Essential hypertension 01/24/2020   Ganglion cyst of right foot    Hyperlipidemia    Hypertension    Irregular astigmatism of both eyes 05/25/2018   S/p RK and subsequent PRK refractive surgery with irregular surface   Male hypogonadism    Mixed dyslipidemia 11/12/2019   Nuclear sclerotic cataract of both eyes 05/25/2018   Obesity (BMI 30.0-34.9) 01/22/2021   Overweight 11/12/2019   Postoperative wound infection 07/22/2016   Preoperative cardiovascular examination 10/30/2022   S/P ACL reconstruction 07/22/2016   Sleep apnea in adult    Venous insufficiency     Past Surgical History:  Procedure Laterality Date   CARPAL TUNNEL RELEASE Right    INCISION AND DRAINAGE Right 07/19/2016   Procedure: INCISION AND DRAINAGE;  Surgeon: Evalene JONETTA Chancy, MD;  Location: [facility];  Service: Orthopedics;  Laterality: Right;   IR GENERIC HISTORICAL  07/22/2016   IR FLUORO GUIDE CV LINE RIGHT 07/22/2016 Franky Rusk, PA-C [facility]   IR GENERIC HISTORICAL  07/22/2016  IR US  GUIDE VASC ACCESS RIGHT 07/22/2016 Franky Rusk, PA-C [facility]   KNEE ARTHROSCOPY Right 07/19/2016   Procedure: ARTHROSCOPY RIGHT KNEE WITH CONDROPLASTY;  Surgeon: Evalene JONETTA Chancy, MD;  Location: [facility];  Service: Orthopedics;  Laterality: Right;   R Knee scope Right 06/17/2016   SHOULDER SURGERY Left    VASECTOMY      Current Medications: Active Medications[1]   Allergies:   Prednisone   Social History   Socioeconomic History   Marital status: Single    Spouse  name: Not on file   Number of children: Not on file   Years of education: Not on file   Highest education level: Not on file  Occupational History   Not on file  Tobacco Use   Smoking status: Former    Current packs/day: 0.25    Average packs/day: 0.3 packs/day for 15.0 years (3.8 ttl pk-yrs)    Types: Cigars, Cigarettes   Smokeless tobacco: Never  Substance and Sexual Activity   Alcohol use: Not Currently   Drug use: No   Sexual activity: Not on file  Other Topics Concern   Not on file  Social History Narrative   Not on file   Social Drivers of Health   Tobacco Use: Medium Risk (09/07/2024)   Patient History    Smoking Tobacco Use: Former    Smokeless Tobacco Use: Never    Passive Exposure: Not on Actuary Strain: Not on file  Food Insecurity: Not on file  Transportation Needs: Not on file  Physical Activity: Not on file  Stress: Not on file  Social Connections: Not on file  Depression (EYV7-0): Not on file  Alcohol Screen: Not on file  Housing: Not on file  Utilities: Not on file  Health Literacy: Not on file     Family History: The patient's family history includes Atrial fibrillation in his mother; Breast cancer in his mother; Heart attack in his father; Hyperlipidemia in his mother; Hypertension in his mother.  ROS:   Please see the history of present illness.    All other systems reviewed and are negative.  EKGs/Labs/Other Studies Reviewed:    The following studies were reviewed today: .SABRAEKG Interpretation Date/Time:  Tuesday September 07 2024 08:19:00 EST Ventricular Rate:  72 PR Interval:  116 QRS Duration:  110 QT Interval:  414 QTC Calculation: 453 R Axis:   66  Text Interpretation: Normal sinus rhythm Incomplete right bundle branch block Nonspecific T wave abnormality Abnormal ECG When compared with ECG of 27-Jun-2003 13:52, Incomplete right bundle branch block is now Present Confirmed by Edwyna Backers [phone]) on 09/07/2024  8:30:36 AM     Recent Labs: No results found for requested labs within last 365 days.  Recent Lipid Panel    Component Value Date/Time   CHOL 132 06/24/2023 0915   TRIG 99 06/24/2023 0915   HDL 29 (L) 06/24/2023 0915   CHOLHDL 4.6 06/24/2023 0915   LDLCALC 84 06/24/2023 0915    Physical Exam:    VS:  BP 130/70   Pulse 72   Ht 6' 2 (1.88 m)   Wt 258 lb 6.4 oz (117.2 kg)   SpO2 95%   BMI 33.18 kg/m     Wt Readings from Last 3 Encounters:  09/07/24 258 lb 6.4 oz (117.2 kg)  06/24/23 291 lb 6.4 oz (132.2 kg)  10/30/22 277 lb 3.2 oz (125.7 kg)     GEN: Patient is in no acute distress HEENT: Normal  NECK: No JVD; No carotid bruits LYMPHATICS: No lymphadenopathy CARDIAC: Hear sounds regular, 2/6 systolic murmur at the apex. RESPIRATORY:  Clear to auscultation without rales, wheezing or rhonchi  ABDOMEN: Soft, non-tender, non-distended MUSCULOSKELETAL:  No edema; No deformity  SKIN: Warm and dry NEUROLOGIC:  Alert and oriented x 3 PSYCHIATRIC:  Normal affect   Signed, Jennifer JONELLE Crape, MD  09/07/2024 8:35 AM    [facility]      [1]  Current Meds  Medication Sig   albuterol  (VENTOLIN  HFA) 108 (90 Base) MCG/ACT inhaler Inhale 1-2 puffs into the lungs as needed for wheezing or shortness of breath.   amphetamine-dextroamphetamine (ADDERALL) 20 MG tablet Take 20 mg by mouth 2 (two) times daily.   aspirin EC 81 MG tablet Take 81 mg by mouth daily.   gabapentin (NEURONTIN) 300 MG capsule Take 300 mg by mouth at bedtime.   levalbuterol (XOPENEX HFA) 45 MCG/ACT inhaler Inhale 2 puffs into the lungs every 4 (four) hours as needed for wheezing or shortness of breath.   rosuvastatin  (CRESTOR ) 40 MG tablet TAKE 1 TABLET BY MOUTH EVERY DAY   tadalafil (CIALIS) 20 MG tablet Take 20 mg by mouth as needed for erectile dysfunction.   testosterone  cypionate (DEPOTESTOSTERONE CYPIONATE) 200 MG/ML injection Inject 0.5 mLs into the muscle once a week.    valsartan  (DIOVAN ) 320 MG tablet Take 320 mg by mouth at bedtime.   venlafaxine XR (EFFEXOR-XR) 150 MG 24 hr capsule Take 150 mg by mouth daily with breakfast.   [DISCONTINUED] montelukast (SINGULAIR) 10 MG tablet Take 10 mg by mouth daily.   [DISCONTINUED] TRELEGY ELLIPTA 100-62.5-25 MCG/ACT AEPB Inhale 1 puff into the lungs daily.   "

## 2024-09-07 NOTE — Patient Instructions (Signed)
